# Patient Record
Sex: Male | Born: 1985
Health system: Southern US, Community
[De-identification: ages and names within clinical notes are randomized; demographics above are authoritative.]

## PROBLEM LIST (undated history)

## (undated) DIAGNOSIS — K649 Unspecified hemorrhoids: Secondary | ICD-10-CM

## (undated) DIAGNOSIS — K219 Gastro-esophageal reflux disease without esophagitis: Secondary | ICD-10-CM

## (undated) DIAGNOSIS — J189 Pneumonia, unspecified organism: Secondary | ICD-10-CM

## (undated) DIAGNOSIS — J309 Allergic rhinitis, unspecified: Secondary | ICD-10-CM

## (undated) DIAGNOSIS — R51 Headache: Secondary | ICD-10-CM

## (undated) DIAGNOSIS — F419 Anxiety disorder, unspecified: Secondary | ICD-10-CM

## (undated) DIAGNOSIS — Z9889 Other specified postprocedural states: Secondary | ICD-10-CM

## (undated) DIAGNOSIS — L0291 Cutaneous abscess, unspecified: Secondary | ICD-10-CM

## (undated) DIAGNOSIS — T4145XA Adverse effect of unspecified anesthetic, initial encounter: Secondary | ICD-10-CM

## (undated) DIAGNOSIS — R112 Nausea with vomiting, unspecified: Secondary | ICD-10-CM

## (undated) HISTORY — PX: TONSILLECTOMY: SUR1361

## (undated) HISTORY — DX: Cutaneous abscess, unspecified: L02.91

## (undated) HISTORY — DX: Allergic rhinitis, unspecified: J30.9

## (undated) HISTORY — DX: Unspecified hemorrhoids: K64.9

## (undated) HISTORY — DX: Gastro-esophageal reflux disease without esophagitis: K21.9

## (undated) HISTORY — DX: Anxiety disorder, unspecified: F41.9

---
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
## 2007-04-22 HISTORY — PX: FRACTURE SURGERY: SHX138

## 2007-05-17 ENCOUNTER — Encounter: Payer: Self-pay | Admitting: Internal Medicine

## 2007-05-26 ENCOUNTER — Encounter: Payer: Self-pay | Admitting: Internal Medicine

## 2007-05-26 ENCOUNTER — Ambulatory Visit: Payer: Self-pay | Admitting: Internal Medicine

## 2007-05-28 ENCOUNTER — Ambulatory Visit: Payer: Self-pay | Admitting: Internal Medicine

## 2007-05-28 ENCOUNTER — Telehealth: Payer: Self-pay | Admitting: Internal Medicine

## 2007-05-28 DIAGNOSIS — J0100 Acute maxillary sinusitis, unspecified: Secondary | ICD-10-CM

## 2007-05-28 DIAGNOSIS — J309 Allergic rhinitis, unspecified: Secondary | ICD-10-CM | POA: Insufficient documentation

## 2007-05-28 HISTORY — DX: Allergic rhinitis, unspecified: J30.9

## 2007-05-28 LAB — CONVERTED CEMR LAB: Rapid Strep: NEGATIVE

## 2007-07-08 ENCOUNTER — Ambulatory Visit: Payer: Self-pay | Admitting: Internal Medicine

## 2007-07-08 DIAGNOSIS — IMO0002 Reserved for concepts with insufficient information to code with codable children: Secondary | ICD-10-CM | POA: Insufficient documentation

## 2007-07-18 ENCOUNTER — Emergency Department (HOSPITAL_COMMUNITY): Admission: EM | Admit: 2007-07-18 | Discharge: 2007-07-18 | Payer: Self-pay | Admitting: Emergency Medicine

## 2007-08-23 ENCOUNTER — Telehealth: Payer: Self-pay | Admitting: Internal Medicine

## 2007-08-27 ENCOUNTER — Ambulatory Visit: Payer: Self-pay | Admitting: Internal Medicine

## 2007-08-27 DIAGNOSIS — J069 Acute upper respiratory infection, unspecified: Secondary | ICD-10-CM | POA: Insufficient documentation

## 2007-11-26 ENCOUNTER — Ambulatory Visit: Payer: Self-pay | Admitting: Internal Medicine

## 2007-11-26 DIAGNOSIS — L259 Unspecified contact dermatitis, unspecified cause: Secondary | ICD-10-CM

## 2007-12-02 ENCOUNTER — Emergency Department (HOSPITAL_BASED_OUTPATIENT_CLINIC_OR_DEPARTMENT_OTHER): Admission: EM | Admit: 2007-12-02 | Discharge: 2007-12-02 | Payer: Self-pay | Admitting: Emergency Medicine

## 2007-12-07 ENCOUNTER — Telehealth: Payer: Self-pay | Admitting: Internal Medicine

## 2008-01-20 ENCOUNTER — Ambulatory Visit: Payer: Self-pay | Admitting: Internal Medicine

## 2008-02-08 ENCOUNTER — Telehealth: Payer: Self-pay | Admitting: Internal Medicine

## 2008-03-20 ENCOUNTER — Telehealth: Payer: Self-pay | Admitting: Internal Medicine

## 2008-03-20 ENCOUNTER — Ambulatory Visit: Payer: Self-pay | Admitting: Internal Medicine

## 2008-03-20 DIAGNOSIS — G4700 Insomnia, unspecified: Secondary | ICD-10-CM | POA: Insufficient documentation

## 2008-03-20 DIAGNOSIS — H00019 Hordeolum externum unspecified eye, unspecified eyelid: Secondary | ICD-10-CM

## 2008-03-22 ENCOUNTER — Telehealth: Payer: Self-pay | Admitting: Internal Medicine

## 2008-05-01 ENCOUNTER — Ambulatory Visit: Payer: Self-pay | Admitting: Internal Medicine

## 2008-05-01 ENCOUNTER — Telehealth: Payer: Self-pay | Admitting: Internal Medicine

## 2008-05-01 DIAGNOSIS — R05 Cough: Secondary | ICD-10-CM

## 2008-05-01 DIAGNOSIS — J209 Acute bronchitis, unspecified: Secondary | ICD-10-CM

## 2008-05-01 DIAGNOSIS — T7840XA Allergy, unspecified, initial encounter: Secondary | ICD-10-CM | POA: Insufficient documentation

## 2008-05-02 ENCOUNTER — Telehealth: Payer: Self-pay | Admitting: Internal Medicine

## 2008-05-19 ENCOUNTER — Ambulatory Visit: Payer: Self-pay | Admitting: Internal Medicine

## 2008-05-22 ENCOUNTER — Ambulatory Visit: Payer: Self-pay | Admitting: Internal Medicine

## 2008-05-22 DIAGNOSIS — J13 Pneumonia due to Streptococcus pneumoniae: Secondary | ICD-10-CM | POA: Insufficient documentation

## 2008-06-23 ENCOUNTER — Telehealth: Payer: Self-pay | Admitting: Internal Medicine

## 2008-11-13 ENCOUNTER — Telehealth: Payer: Self-pay | Admitting: Internal Medicine

## 2009-02-23 ENCOUNTER — Ambulatory Visit: Payer: Self-pay | Admitting: Family Medicine

## 2009-05-03 ENCOUNTER — Ambulatory Visit: Payer: Self-pay | Admitting: Family Medicine

## 2010-05-13 ENCOUNTER — Telehealth: Payer: Self-pay | Admitting: Internal Medicine

## 2010-05-14 ENCOUNTER — Encounter: Payer: Self-pay | Admitting: *Deleted

## 2010-05-14 ENCOUNTER — Ambulatory Visit: Admit: 2010-05-14 | Payer: Self-pay | Admitting: Internal Medicine

## 2010-05-14 ENCOUNTER — Telehealth: Payer: Self-pay | Admitting: *Deleted

## 2010-05-14 DIAGNOSIS — L309 Dermatitis, unspecified: Secondary | ICD-10-CM | POA: Insufficient documentation

## 2010-05-14 NOTE — Telephone Encounter (Signed)
Appointment today

## 2010-05-21 NOTE — Assessment & Plan Note (Signed)
Summary: congestion//ccm   Vital Signs:  Patient profile:   25 year old male Temp:     98.6 degrees F oral BP sitting:   130 / 82  (left arm) Cuff size:   regular  Vitals Entered By: Sid Falcon LPN (May 03, 2009 2:30 PM) CC: Nasal congestion X 3-4 days   History of Present Illness: Acute visit. For a history of nasal congestion and cough mostly dry. Sinus congestion and sinus pressure with intermittent headache. History of probable pneumonia last year. Felt somewhat similar then. Diffuse body aches. Mild sore throat. Denies any nausea, vomiting, or skin rashes. Nonsmoker.  Allergies: 1)  ! Biaxin (Clarithromycin) 2)  ! Penicillin G Sodium (Penicillin G Sodium) 3)  ! Bactrim Ds (Sulfamethoxazole-Trimethoprim)  Past History:  Past Medical History: Last updated: 05/28/2007 Allergic rhinitis  Review of Systems      See HPI  Physical Exam  General:  Well-developed,well-nourished,in no acute distress; alert,appropriate and cooperative throughout examination Head:  Normocephalic and atraumatic without obvious abnormalities. No apparent alopecia or balding. Eyes:  No corneal or conjunctival inflammation noted. EOMI. Perrla. Funduscopic exam benign, without hemorrhages, exudates or papilledema. Vision grossly normal. Ears:  External ear exam shows no significant lesions or deformities.  Otoscopic examination reveals clear canals, tympanic membranes are intact bilaterally without bulging, retraction, inflammation or discharge. Hearing is grossly normal bilaterally. Nose:  External nasal examination shows no deformity or inflammation. Nasal mucosa are pink and moist without lesions or exudates. Mouth:  Oral mucosa and oropharynx without lesions or exudates.  Teeth in good repair. Neck:  No deformities, masses, or tenderness noted. Lungs:  Normal respiratory effort, chest expands symmetrically. Lungs are clear to auscultation, no crackles or wheezes. Heart:  Normal rate and  regular rhythm. S1 and S2 normal without gallop, murmur, click, rub or other extra sounds.   Impression & Recommendations:  Problem # 1:  URI (ICD-465.9) at this point, suspect viral origin. No antibiotics recommended. Only start antibiotics if he has sudden fever or progressive productive cough. He has allergies to multiple drugs including sulfa, penicillin, and Biaxin.  Complete Medication List: 1)  Levaquin 500 Mg Tabs (Levofloxacin) .... One by mouth once daily for 7 days  Patient Instructions: 1)  Acute Bronchitis symptoms for less then 10 days are not  helped by antibiotics. Take over the counter cough medications. Call if no improvement in 5-7 days, sooner if increasing cough, fever, or new symptoms ( shortness of breath, chest pain) .  Prescriptions: LEVAQUIN 500 MG TABS (LEVOFLOXACIN) one by mouth once daily for 7 days  #7 x 0   Entered and Authorized by:   Evelena Peat MD   Signed by:   Evelena Peat MD on 05/03/2009   Method used:   Print then Give to Patient   RxID:   [id number]

## 2010-05-23 NOTE — Progress Notes (Signed)
  Phone Note Call from Patient   Summary of Call: call from g-mother- pt co ming home from staying with [facility] and is very sick- request to see dr Naoma Diener this pm--g-mother number is [phone] Initial call taken by: Willy Eddy, LPN,  May 13, 2010 9:15 AM  Follow-up for Phone Call        per dr j Rudene Re- can come in tomorrow at 12 noon-   Follow-up by: Willy Eddy, LPN,  May 13, 2010 9:23 AM  Additional Follow-up for Phone Call Additional follow up Details #1::        Appt tomorow at [facility] Additional Follow-up by: [facility] CMA AAMA,  May 13, 2010 10:00 AM

## 2010-06-13 ENCOUNTER — Ambulatory Visit (INDEPENDENT_AMBULATORY_CARE_PROVIDER_SITE_OTHER): Payer: 59 | Admitting: Internal Medicine

## 2010-06-13 ENCOUNTER — Encounter: Payer: Self-pay | Admitting: Internal Medicine

## 2010-06-13 ENCOUNTER — Telehealth: Payer: Self-pay | Admitting: *Deleted

## 2010-06-13 VITALS — BP 136/80 | HR 64 | Temp 98.7°F | Resp 14 | Ht 72.0 in | Wt 208.0 lb

## 2010-06-13 DIAGNOSIS — F329 Major depressive disorder, single episode, unspecified: Secondary | ICD-10-CM | POA: Insufficient documentation

## 2010-06-13 DIAGNOSIS — F341 Dysthymic disorder: Secondary | ICD-10-CM

## 2010-06-13 DIAGNOSIS — R110 Nausea: Secondary | ICD-10-CM

## 2010-06-13 DIAGNOSIS — K219 Gastro-esophageal reflux disease without esophagitis: Secondary | ICD-10-CM

## 2010-06-13 MED ORDER — MIRTAZAPINE 30 MG PO TABS: 30.0000 mg | ORAL_TABLET | Freq: Every day | ORAL | Status: AC

## 2010-06-13 MED ORDER — METOCLOPRAMIDE HCL 5 MG PO TABS: 5.0000 mg | ORAL_TABLET | Freq: Four times a day (QID) | ORAL | Status: AC | PRN

## 2010-06-13 MED ORDER — PROMETHAZINE HCL 25 MG PO TABS: 25.0000 mg | ORAL_TABLET | Freq: Four times a day (QID) | ORAL | Status: DC | PRN

## 2010-06-13 NOTE — Progress Notes (Signed)
Subjective:    Patient ID: Devin Burton, male    DOB: 06-23-1985, 25 y.o.   MRN: [id number]  HPI  patient is a 25 year old white male who presents with two complaint. The first is a sense of nausea that occurs first thing in the morning he states that he awakened with nausea and sometimes it worsens during the day especially after eating he has felt like he needed to throw up after meals on several occasions.  This caused a decrease in appetite however his weight is stable at this point. 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  He states that he gets an average of 2-3 hours of sleep a night has restless sleep when he is in bed and does admit to some decreased symptoms of satisfaction in  Life's  Activity but denies any suicidal  Ideology.                                                                                                                                                                                                                                                                                                                                                                                                                                    tReview of Systems  Constitutional: Negative for fever and fatigue.  HENT: Negative for hearing loss, congestion, neck pain and postnasal drip.   Eyes: Negative for discharge, redness and visual disturbance.  Respiratory: Negative for cough, shortness of breath and wheezing.   Cardiovascular: Negative for leg swelling.  Gastrointestinal: Negative for abdominal pain, constipation and abdominal distention.  Genitourinary: Negative for urgency and frequency.  Musculoskeletal: Negative for joint swelling and arthralgias.  Skin: Negative for color change and rash.  Neurological:  Negative for weakness and light-headedness.  Hematological: Negative for adenopathy.  Psychiatric/Behavioral: Negative for behavioral problems.   Past Medical History  Diagnosis Date  . INSOMNIA, CHRONIC 03/20/2008  . ALLERGIC RHINITIS 05/28/2007   Past Surgical History  Procedure Date  . Fracture surgery 2009    right wrist    reports that he has never smoked. He does not have any smokeless tobacco history on file. He reports that he does not drink alcohol or use illicit drugs. family history includes Cancer in his father and HIV in his father and mother.        Objective:   Physical Exam  Constitutional: He is oriented to person, place, and time. He appears well-developed and well-nourished.  HENT:  Head: Normocephalic and atraumatic.  Eyes: Conjunctivae are normal. Pupils are equal, round, and reactive to light.  Neck: Normal range of motion. Neck supple.  Cardiovascular: Normal rate and regular rhythm.   Pulmonary/Chest: Effort normal and breath sounds normal.  Abdominal: Soft. Bowel sounds are normal. He exhibits no mass. There is no tenderness. There is no rebound and no guarding.  Musculoskeletal: Normal range of motion.  Neurological: He is alert and oriented to person, place, and time.  Psychiatric: His behavior is normal. His mood appears anxious. Thought content is not delusional. He exhibits a depressed mood. He expresses no suicidal plans and no homicidal plans. He is attentive.          Assessment & Plan:   The patient is a healthy appearing white male he has 2 distinct problems today one is gastroesophageal reflux disease with possible P. Ulcerative disease we'll put him on a trial of Nexium 40 mg by mouth daily for the next 6 weeks for his morning nausea we'll give him a temporary prescription of Reglan 5 mg by mouth when necessary nausea not to exceed 4 doses a day and will begin Remeron 30 mg at bedtime for treatment of his depression. He will followup in 4-5  weeks

## 2010-06-13 NOTE — Telephone Encounter (Signed)
Pt was seen today for nausea and now he has diarrhea.  Per Dr Lovell Sheehan don't take any new meds except for nexium and call in Phenergan 25 mg 1 po every 4-6 hrs prn for nausea.  And clear diet.  Pt aware

## 2010-07-15 ENCOUNTER — Ambulatory Visit: Payer: 59 | Admitting: Internal Medicine

## 2010-09-04 ENCOUNTER — Ambulatory Visit (INDEPENDENT_AMBULATORY_CARE_PROVIDER_SITE_OTHER): Payer: 59 | Admitting: Internal Medicine

## 2010-09-04 ENCOUNTER — Encounter: Payer: Self-pay | Admitting: Internal Medicine

## 2010-09-04 VITALS — BP 120/80 | HR 68 | Temp 98.7°F | Ht 71.0 in | Wt 220.0 lb

## 2010-09-04 DIAGNOSIS — K219 Gastro-esophageal reflux disease without esophagitis: Secondary | ICD-10-CM

## 2010-09-04 DIAGNOSIS — L738 Other specified follicular disorders: Secondary | ICD-10-CM

## 2010-09-04 DIAGNOSIS — L739 Follicular disorder, unspecified: Secondary | ICD-10-CM

## 2010-09-04 DIAGNOSIS — K649 Unspecified hemorrhoids: Secondary | ICD-10-CM

## 2010-09-04 MED ORDER — BENZOYL PEROXIDE 2.5 % EX GEL: 1.0000 "application " | Freq: Two times a day (BID) | CUTANEOUS | Status: DC

## 2010-09-04 MED ORDER — DOXYCYCLINE HYCLATE 100 MG PO TABS: 100.0000 mg | ORAL_TABLET | Freq: Two times a day (BID) | ORAL | Status: DC

## 2010-09-04 MED ORDER — ESOMEPRAZOLE MAGNESIUM 20 MG PO CPDR: 20.0000 mg | DELAYED_RELEASE_CAPSULE | Freq: Every day | ORAL | Status: DC

## 2010-09-04 NOTE — Patient Instructions (Signed)
Use Dove soap only and a washcloth to clean the area in the buttocks Do  not use any antibiotics soaps. Use the Dove unscented and do not use any scented soaps or washes. Cotton briefs and looser jeans to let the area stay dry white working outside   For  internal hemorrhoids you need to use the suppositories rather than topical cream the blood is coming from hemorrhoids inside the rectum

## 2010-09-04 NOTE — Progress Notes (Signed)
  Subjective:    Patient ID: Devin Burton, male    DOB: 01-04-1986, 25 y.o.   MRN: [id number]  HPI  Pt has internal hemorhoids This was seen in urgent care for a rash on the buttocks bilaterally he was given a different medications including topical and antifungals.  On physical examination today it is appearing to be ingrown hairs and folliculitis.  Has a history of internal hemorrhoids have flared recently worsened with the medications that he was given    Review of Systems  Constitutional: Negative for fever and fatigue.  HENT: Negative for hearing loss, congestion, neck pain and postnasal drip.   Eyes: Negative for discharge, redness and visual disturbance.  Respiratory: Negative for cough, shortness of breath and wheezing.   Cardiovascular: Negative for leg swelling.  Gastrointestinal: Negative for abdominal pain, constipation and abdominal distention.  Genitourinary: Negative for urgency and frequency.  Musculoskeletal: Negative for joint swelling and arthralgias.  Skin: Negative for color change and rash.  Neurological: Negative for weakness and light-headedness.  Hematological: Negative for adenopathy.  Psychiatric/Behavioral: Negative for behavioral problems.   Past Medical History  Diagnosis Date  . INSOMNIA, CHRONIC 03/20/2008  . ALLERGIC RHINITIS 05/28/2007   Past Surgical History  Procedure Date  . Fracture surgery 2009    right wrist    reports that he has never smoked. He does not have any smokeless tobacco history on file. He reports that he does not drink alcohol or use illicit drugs. family history includes Cancer in his father and HIV in his father and mother. Allergies  Allergen Reactions  . Clarithromycin     REACTION: SOB, Palpitations  . Penicillins   . Sulfamethoxazole W/Trimethoprim        Objective:   Physical Exam  Constitutional: He appears well-developed and well-nourished.  HENT:  Head: Normocephalic and atraumatic.  Eyes: Conjunctivae  are normal. Pupils are equal, round, and reactive to light.  Neck: Normal range of motion. Neck supple.  Cardiovascular: Normal rate and regular rhythm.   Pulmonary/Chest: Effort normal and breath sounds normal.  Abdominal: Soft. Bowel sounds are normal.  Genitourinary:       Internal hemorrhoids  Skin: Rash noted. There is erythema.          Assessment & Plan:  Doxycycline 1 by mouth twice a day for 14 days and the peroxide cleanser twice daily.  4 hemorrhoids hemorrhoid suppositories were suggested as opposed to hemorrhoid creams and increased fiber in his diet

## 2010-09-11 ENCOUNTER — Telehealth: Payer: Self-pay

## 2010-09-11 ENCOUNTER — Ambulatory Visit (INDEPENDENT_AMBULATORY_CARE_PROVIDER_SITE_OTHER): Payer: 59 | Admitting: Internal Medicine

## 2010-09-11 ENCOUNTER — Encounter: Payer: Self-pay | Admitting: Internal Medicine

## 2010-09-11 DIAGNOSIS — K649 Unspecified hemorrhoids: Secondary | ICD-10-CM

## 2010-09-11 DIAGNOSIS — K625 Hemorrhage of anus and rectum: Secondary | ICD-10-CM

## 2010-09-11 LAB — CBC WITH DIFFERENTIAL/PLATELET
Basophils Absolute: 0 10*3/uL (ref 0.0–0.1)
Basophils Relative: 0.5 % (ref 0.0–3.0)
Eosinophils Absolute: 0.6 10*3/uL (ref 0.0–0.7)
Eosinophils Relative: 7 % — ABNORMAL HIGH (ref 0.0–5.0)
HCT: 46.5 % (ref 39.0–52.0)
Hemoglobin: 16 g/dL (ref 13.0–17.0)
Lymphocytes Relative: 33 % (ref 12.0–46.0)
Lymphs Abs: 2.7 10*3/uL (ref 0.7–4.0)
MCHC: 34.3 g/dL (ref 30.0–36.0)
MCV: 91.1 fl (ref 78.0–100.0)
Monocytes Absolute: 0.8 10*3/uL (ref 0.1–1.0)
Monocytes Relative: 10.2 % (ref 3.0–12.0)
Neutro Abs: 4 10*3/uL (ref 1.4–7.7)
Neutrophils Relative %: 49.3 % (ref 43.0–77.0)
Platelets: 246 10*3/uL (ref 150.0–400.0)
RBC: 5.11 Mil/uL (ref 4.22–5.81)
RDW: 12.7 % (ref 11.5–14.6)
WBC: 8.2 10*3/uL (ref 4.5–10.5)

## 2010-09-11 MED ORDER — HYDROCORTISONE ACE-PRAMOXINE 1-1 % RE FOAM: 1.0000 | Freq: Two times a day (BID) | RECTAL | Status: AC

## 2010-09-11 NOTE — Telephone Encounter (Signed)
Pt.notified

## 2010-09-11 NOTE — Telephone Encounter (Signed)
Message copied by Kyung Rudd on Wed Sep 11, 2010  4:04 PM ------      Message from: Letitia Libra, Maisie Fus      Created: Wed Sep 11, 2010  1:05 PM       Cbc nl

## 2010-09-11 NOTE — Progress Notes (Signed)
  Subjective:    Patient ID: Devin Burton, male    DOB: 1985/05/12, 25 y.o.   MRN: [id number]  HPI Pt presents to clinic for evaluation of rectal bleeding. Has known history of hemorrhoids per patient. States in the past as noted external hemorrhoid protruding. Has intermittent exacerbations with discomfort and itching and currently notes the symptoms. This morning while having a bowel movement. Moderate amount of red blood in the toilet and on wiping. Is attempting suppository for his hemorrhoids. No recent constipation or straining. No alleviating or exacerbating factors. No other complaints  Reviewed past medical history, medications and allergies.  Review of Systems  Constitutional: Negative for fever and chills.  Gastrointestinal: Positive for anal bleeding and rectal pain. Negative for nausea and abdominal pain.       Objective:   Physical Exam  Nursing note and vitals reviewed. Constitutional: He appears well-developed and well-nourished. No distress.  HENT:  Head: Normocephalic and atraumatic.  Eyes: Conjunctivae are normal. No scleral icterus.  Genitourinary:       Rectum examined. Soft tissue prominence approximately 9:00 position mildly tender to palpation. No obvious fissure.  Skin: Skin is warm and dry. No rash noted. He is not diaphoretic. No erythema.  Psychiatric: He has a normal mood and affect.          Assessment & Plan:

## 2010-09-11 NOTE — Assessment & Plan Note (Signed)
Likely hemorrhoidal source based on history and exam. Obtain CBC given increase in blood noted. Attempt ProctoFoam HC. Followup closely with PMD if no improvement or worsening. States understanding and agreement.

## 2010-09-13 ENCOUNTER — Ambulatory Visit (INDEPENDENT_AMBULATORY_CARE_PROVIDER_SITE_OTHER): Payer: 59 | Admitting: Internal Medicine

## 2010-09-13 ENCOUNTER — Encounter: Payer: Self-pay | Admitting: Internal Medicine

## 2010-09-13 DIAGNOSIS — K625 Hemorrhage of anus and rectum: Secondary | ICD-10-CM | POA: Insufficient documentation

## 2010-09-13 DIAGNOSIS — Z206 Contact with and (suspected) exposure to human immunodeficiency virus [HIV]: Secondary | ICD-10-CM

## 2010-09-13 DIAGNOSIS — Z20828 Contact with and (suspected) exposure to other viral communicable diseases: Secondary | ICD-10-CM

## 2010-09-13 DIAGNOSIS — Z9189 Other specified personal risk factors, not elsewhere classified: Secondary | ICD-10-CM

## 2010-09-13 DIAGNOSIS — Z202 Contact with and (suspected) exposure to infections with a predominantly sexual mode of transmission: Secondary | ICD-10-CM

## 2010-09-13 LAB — CBC WITH DIFFERENTIAL/PLATELET
Basophils Absolute: 0.1 10*3/uL (ref 0.0–0.1)
Basophils Relative: 0.6 % (ref 0.0–3.0)
Eosinophils Absolute: 0.5 10*3/uL (ref 0.0–0.7)
Eosinophils Relative: 5.5 % — ABNORMAL HIGH (ref 0.0–5.0)
HCT: 46.1 % (ref 39.0–52.0)
Hemoglobin: 15.9 g/dL (ref 13.0–17.0)
Lymphocytes Relative: 28.6 % (ref 12.0–46.0)
Lymphs Abs: 2.5 10*3/uL (ref 0.7–4.0)
MCHC: 34.5 g/dL (ref 30.0–36.0)
MCV: 91.2 fl (ref 78.0–100.0)
Monocytes Absolute: 1 10*3/uL (ref 0.1–1.0)
Monocytes Relative: 11.3 % (ref 3.0–12.0)
Neutro Abs: 4.7 10*3/uL (ref 1.4–7.7)
Neutrophils Relative %: 54 % (ref 43.0–77.0)
Platelets: 235 10*3/uL (ref 150.0–400.0)
RBC: 5.06 Mil/uL (ref 4.22–5.81)
RDW: 12.9 % (ref 11.5–14.6)
WBC: 8.8 10*3/uL (ref 4.5–10.5)

## 2010-09-13 NOTE — Assessment & Plan Note (Signed)
Repeat CBC given increasing amount of blood. Failed to visualize adequately with endoscopy. GI consult. Present to the emergency department with increased bleeding abdominal pain dizziness or syncope.

## 2010-09-13 NOTE — Progress Notes (Signed)
  Subjective:    Patient ID: Devin Burton, male    DOB: [birth year], 25 y.o.   MRN: [id number]  HPI Pt presents to clinic for evaluation of rectal bleeding. Recently seen forty-eight hours ago with rectal bleeding with bowel movement felt to be possibly representative of hemorrhoid. Since that time has had continued bleeding with every bowel movement with associated rectal pain. Patient provided a picture of last bowel movement with moderate amount of blood on towel and what appears to be a darker clot. Denies dizziness or presyncope. No associated abdominal pain. No alleviating or exacerbating factors. Also requests HIV testing as he indicates has a family member with the infection. No high-risk behavior personally. No other complaints  Reviewed past medical history, medications and allergies.  Review of Systems see history of present illness     Objective:   Physical Exam  Nursing note and vitals reviewed. Constitutional: He appears well-developed and well-nourished. No distress.  HENT:  Head: Normocephalic and atraumatic.  Right Ear: External ear normal.  Left Ear: External ear normal.  Nose: Nose normal.  Eyes: Conjunctivae are normal. No scleral icterus.  Genitourinary: Rectal exam shows tenderness. Rectal exam shows no mass and anal tone normal.       Attempted anoscopy but terminated due to patient's discomfort. Visualized approximately 2 cm without lesion, mass or evidence of active bleeding  Neurological: He is alert.  Skin: Skin is warm and dry. He is not diaphoretic.  Psychiatric: He has a normal mood and affect.          Assessment & Plan:

## 2010-09-13 NOTE — Assessment & Plan Note (Signed)
No direct inoculation. Obtain HIV test

## 2010-09-17 ENCOUNTER — Ambulatory Visit (INDEPENDENT_AMBULATORY_CARE_PROVIDER_SITE_OTHER): Payer: 59 | Admitting: Internal Medicine

## 2010-09-17 ENCOUNTER — Encounter: Payer: Self-pay | Admitting: Internal Medicine

## 2010-09-17 VITALS — BP 134/80 | HR 86 | Ht 71.0 in | Wt 217.0 lb

## 2010-09-17 DIAGNOSIS — L0233 Carbuncle of buttock: Secondary | ICD-10-CM

## 2010-09-17 DIAGNOSIS — L0232 Furuncle of buttock: Secondary | ICD-10-CM | POA: Insufficient documentation

## 2010-09-17 DIAGNOSIS — K602 Anal fissure, unspecified: Secondary | ICD-10-CM

## 2010-09-17 DIAGNOSIS — K625 Hemorrhage of anus and rectum: Secondary | ICD-10-CM

## 2010-09-17 MED ORDER — AMBULATORY NON FORMULARY MEDICATION: Status: DC

## 2010-09-17 NOTE — Patient Instructions (Addendum)
Start 1 dose of MiraLax daily and increase fiber in your diet. Start the diltiazem gel - go to [facility] to get that.  High-Fiber Diet A high-fiber diet changes your normal diet to include more whole grains, legumes, fruits, and vegetables. Changes in the diet involve replacing refined carbohydrates with unrefined foods. The calorie level of the diet is essentially unchanged. The Dietary Reference Intake (recommended amount) for adult males is 38 grams per day. For adult females, it is 25 grams per day. Pregnant and lactating women should consume 28 grams of fiber per day. Fiber is the intact part of a plant that is not broken down during digestion. Functional fiber is fiber that has been isolated from the plant to provide a beneficial effect in the body. PURPOSE  Increase stool bulk.   Ease and regulate bowel movements.   Lower cholesterol.  INDICATIONS THAT YOU NEED MORE FIBER  Constipation and hemorrhoids.   Uncomplicated diverticulosis (intestine condition) and irritable bowel syndrome.   Weight management.   As a protective measure against hardening of the arteries (atherosclerosis), diabetes, and cancer.  NOTE OF CAUTION If you have a digestive or bowel problem, ask your caregiver for advice before adding high-fiber foods to your diet. Some of the following medical problems are such that a high-fiber diet should not be used without consulting your caregiver. DO NOT USE WITH:  Acute diverticulitis (intestine infection).   Partial small bowel obstructions.   Complicated diverticular disease involving bleeding, rupture (perforation), or abscess (boil, furuncle).   Presence of autonomic neuropathy (nerve damage) or gastric paresis (stomach cannot empty itself).  GUIDELINES FOR INCREASING FIBER IN THE DIET  Start adding fiber to the diet slowly. A gradual increase of about 5 more grams (2 slices of whole-wheat bread, 2 servings of most fruits or vegetables, or 1 bowl  of high-fiber cereal) per day is best. Too rapid an increase in fiber may result in constipation, flatulence, and bloating.   Drink enough water and fluids to keep your urine clear or pale yellow. Water, juice, or caffeine-free drinks are recommended. Not drinking enough fluid may cause constipation.   Eat a variety of high-fiber foods rather than one type of fiber.   Try to increase your intake of fiber through using high-fiber foods rather than fiber pills or supplements that contain small amounts of fiber.   The goal is to change the types of food eaten. Do not supplement your present diet with high-fiber foods, but replace foods in your present diet.  INCLUDE A VARIETY OF FIBER SOURCES  Replace refined and processed grains with whole grains, canned fruits with fresh fruits, and incorporate other fiber sources. White rice, white breads, and most bakery goods contain little or no fiber.   Brown whole-grain rice, buckwheat oats, and many fruits and vegetables are all good sources of fiber. These include: broccoli, Brussels sprouts, cabbage, cauliflower, beets, sweet potatoes, white potatoes (skin on), carrots, tomatoes, eggplant, squash, berries, fresh fruits, and dried fruits.   Cereals appear to be the richest source of fiber. Cereal fiber is found in whole grains and bran. Bran is the fiber-rich outer coat of cereal grain, which is largely removed in refining. In whole-grain cereals, the bran remains. In breakfast cereals, the largest amount of fiber is found in those with "bran" in their names. The fiber content is sometimes indicated on the label.   You may need to include additional fruits and vegetables each day.   In baking, for 1  cup white flour, you may use the following substitutions:   1 cup whole-wheat flour minus 2 tablespoons.   1/2 cup white flour plus 1/2 cup whole-wheat flour.  References: Dietary Reference Intakes: Recommended Intakes for Individuals. [employer]. Institute of Medicine. Food and Nutrition Board. Document Released: 04/07/2005 Document Re-Released: 07/02/2009 [facility] Patient Information 2011 [place], [place].  Anal Fissure An anal fissure often begins with sharp pain. This is usually following a bowel movement. It often causes bright red blood stained stools. It is the most common cause of rectal bleeding. One common cause of this is passage of a large, hard stool. It can also be caused by having frequent diarrheal stools. Anal fissures that occur for a longtime (chronic) may require surgery. CAUSES  Passing large, hard stools.   Frequent diarrheal stools.   Constipation.  SYMPTOMS  Bright red, blood stained stools.   Rectal bleeding.  HOME CARE INSTRUCTIONS  If constipation is the cause of the rectal fissure, it may be necessary to add a bulk-forming laxative. A diet high in fruits, whole grains, and vegetables will also help.   Taking hot sitz baths for 1 half hour 4 times per day may help.   Increase your fluid intake.   Only take over-the-counter or prescription medicines for pain, discomfort, or fever as directed by your caregiver. Do not take aspirin as this may increase the tendency for bleeding.   Do not use ointments containing anesthetic medications or hydrocortisone. They could slow healing.   Avoid constipating foods such as bananas and cheese.   In children, brown Karo syrup may be used by adding 1 teaspoon of syrup to 8 ounces of formula. An alternative is to give 3 teaspoons of mineral oil every day.  SEEK MEDICAL CARE IF: Rectal bleeding continues, changes in intensity, or becomes more severe. MAKE SURE YOU:   Understand these instructions.   Will watch your condition.   Will get help right away if you are not doing well or get worse.  Document Released: 04/07/2005 Document Re-Released: 07/02/2009 [facility] Patient Information 2011 [place], [place].    You have been scheduled for an  appointmet with Dr, Corliss Skains on September 24, 2010 arrive at 1:00pm. Your prescription has been sent to [facility] in [facility]. Use Miralax as directed.

## 2010-09-17 NOTE — Assessment & Plan Note (Signed)
I think the overall clinical scenario is most consistent with this. We'll work on softening his stools with fiber increase in diet as well as MiraLax. Diltiazem gel 2% will be applied and an inefficient handout was given. Warm sitz baths are recommended. I will arrange a surgery visit for this but also mainly for this boil on his buttock it has not totally responded to antibiotics and may need incision and drainage

## 2010-09-17 NOTE — Progress Notes (Signed)
  Subjective:    Patient ID: Devin Burton, male    DOB: 11-14-1985, 25 y.o.   MRN: [id number]  HPI 25 year old single white man with rectal bleeding and rectal pain for several weeks. He has a history of hemorrhoids she says with intermittent protrusion. He's had some minor blood on the tissue but in the past couple of weeks he's had problems with greater amount of blood mostly still in the tissue. It bright red usually a photograph of a toilet tissue stained with blood. He saw primary care twice last week, his hemoglobin was normal twice. He had some mild protrusion at the anal area and was thought that hemorrhoids and was treated with some positive worries and ProctoFoam. He could not tolerate an anoscopy exam. It was too painful. He is having some increasing constipation and hard stools lately. There is some associated left lower quadrant pain that is relieved with defecation. He has not had any anal trauma. It is very painful to defecate particularly in the posterior aspect of the anal area.  He has also been treated with 2 rounds of antibiotics, doxycycline use, for what is thought to be folliculitis of the buttock area. He does sweat they're quite a bit. He's not had any purulent drainage but is left with at least one lesion he says that is tender, and bothers him some.  Related history is that he is significantly stressed over  recently learning that his father had HIV disease. His father is dying of a brain tumor though he lives out of the area. The patient had requested HIV testing though he has no blood or body fluid exposures he was concerned in that particular study is pending.  He has moved back to this area after being in [place] and [address], he is currently working for the city of [place].  Review of Systems  all other review of systems negative.    Objective:   Physical Exam  Constitutional: He appears well-developed and well-nourished.  HENT:  Head: Normocephalic.    Mouth/Throat: Oropharynx is clear and moist.  Eyes: Conjunctivae are normal. No scleral icterus.  Cardiovascular: Normal rate, regular rhythm and normal heart sounds.   Pulmonary/Chest: Effort normal and breath sounds normal.  Abdominal: Soft. Bowel sounds are normal. He exhibits no mass. There is no tenderness.  Genitourinary:       On the right buttock there is a 6-7 round crusted red lesion that is nontender. No surrounding erythema. This is more in the coccygeal prominence but not at the midline. Inspection of the anoderm shows it looks overall normal. Digital rectal exam with fifth digit shows moderate tenderness posteriorly. He is able to tolerate some of my index finger being inserted as well but is very painful overall. I don't see any discrete hemorrhoids or sentinel pile.          Assessment & Plan:

## 2010-09-17 NOTE — Assessment & Plan Note (Signed)
This is most likely a combination of fissure and hemorrhoids. Given his age and overall situation have not pursue endoscopic evaluation at this time though he could needed. We'll await the surgeon's evaluation and I will see him back in 2 months.

## 2010-09-18 ENCOUNTER — Telehealth: Payer: Self-pay

## 2010-09-18 NOTE — Telephone Encounter (Signed)
Message copied by Beverely Low on Wed Sep 18, 2010  2:22 PM ------      Message from: Staci Righter      Created: Mon Sep 16, 2010  1:11 PM       Cbc nl

## 2010-09-18 NOTE — Telephone Encounter (Signed)
Pt notified and is aware that HIV test is still pending

## 2010-09-20 ENCOUNTER — Telehealth: Payer: Self-pay

## 2010-09-20 LAB — HIV 1/2 CONFIRMATION
HIV-1 antibody: NEGATIVE
HIV-2 Ab: NEGATIVE

## 2010-09-20 NOTE — Telephone Encounter (Signed)
Message copied by Beverely Low on Fri Sep 20, 2010  4:10 PM ------      Message from: Staci Righter      Created: Fri Sep 20, 2010  7:47 AM       negative

## 2010-09-20 NOTE — Telephone Encounter (Signed)
Pt aware. Labs mailed, per pt request

## 2010-09-24 ENCOUNTER — Encounter (INDEPENDENT_AMBULATORY_CARE_PROVIDER_SITE_OTHER): Payer: Self-pay | Admitting: Surgery

## 2010-10-09 ENCOUNTER — Encounter: Payer: 59 | Admitting: Internal Medicine

## 2010-10-09 VITALS — BP 130/80 | HR 72 | Temp 98.2°F | Resp 16 | Ht 71.0 in | Wt 218.0 lb

## 2010-10-09 DIAGNOSIS — J309 Allergic rhinitis, unspecified: Secondary | ICD-10-CM

## 2010-10-09 NOTE — Progress Notes (Signed)
  Subjective:    Patient ID: Devin Burton, male    DOB: 05/17/1985, 25 y.o.   MRN: [id number]  HPI error   Review of Systems     Objective:   Physical Exam        Assessment & Plan:

## 2010-10-28 ENCOUNTER — Other Ambulatory Visit: Payer: Self-pay | Admitting: *Deleted

## 2010-10-28 DIAGNOSIS — K219 Gastro-esophageal reflux disease without esophagitis: Secondary | ICD-10-CM

## 2010-10-28 MED ORDER — ESOMEPRAZOLE MAGNESIUM 20 MG PO CPDR: 20.0000 mg | DELAYED_RELEASE_CAPSULE | Freq: Every day | ORAL | Status: DC

## 2010-11-27 ENCOUNTER — Encounter (INDEPENDENT_AMBULATORY_CARE_PROVIDER_SITE_OTHER): Payer: Self-pay | Admitting: Surgery

## 2010-11-28 ENCOUNTER — Ambulatory Visit (INDEPENDENT_AMBULATORY_CARE_PROVIDER_SITE_OTHER): Payer: 59 | Admitting: Surgery

## 2010-11-28 ENCOUNTER — Encounter (INDEPENDENT_AMBULATORY_CARE_PROVIDER_SITE_OTHER): Payer: Self-pay | Admitting: Surgery

## 2010-11-28 VITALS — BP 122/80 | HR 78 | Temp 99.4°F | Ht 73.0 in | Wt 218.0 lb

## 2010-11-28 DIAGNOSIS — L0231 Cutaneous abscess of buttock: Secondary | ICD-10-CM

## 2010-11-28 NOTE — Patient Instructions (Signed)
Daily - after taking a shower, apply some Neosporin ointment to the wounds, then cover with dry 4x4 gauze and tape.

## 2010-11-28 NOTE — Progress Notes (Signed)
I saw this patient in June of this year in [place]. At that time I drained a small superficial left buttock abscess. Initially this healed up but since that time has developed 2 small polyps that are painful and draining some purulent fluid. The patient is not applying any dressing to this area at this time.  In the left buttock near the coccyx there were 2 small superficial abscesses, each measuring about 1 cm across. These are draining small amounts. The drainage seems to be running into his gluteal cleft causing a little bit of skin irritation. He is only tender around the abscesses. We prepped these with Betadine and anesthetized with 1% lidocaine. I unroofed both of these and expressed a little bit of purulent fluid in the previous some old granulation tissue. He we'll treat these with Neosporin and a dry dressing. Followup in 2 weeks.

## 2010-12-18 ENCOUNTER — Ambulatory Visit (INDEPENDENT_AMBULATORY_CARE_PROVIDER_SITE_OTHER): Payer: 59 | Admitting: Surgery

## 2010-12-18 ENCOUNTER — Encounter (INDEPENDENT_AMBULATORY_CARE_PROVIDER_SITE_OTHER): Payer: Self-pay | Admitting: Surgery

## 2010-12-18 VITALS — BP 148/92 | HR 70 | Temp 99.1°F

## 2010-12-18 DIAGNOSIS — L0231 Cutaneous abscess of buttock: Secondary | ICD-10-CM

## 2010-12-18 DIAGNOSIS — L03317 Cellulitis of buttock: Secondary | ICD-10-CM

## 2010-12-18 NOTE — Progress Notes (Signed)
The patient comes back for another wound check. The small area has healed up completely. However the larger more inferior site remains mildly swollen with some purulent drainage. This seems mildly tender. There is no surrounding cellulitis. It does not seem to track very deeply. At this point I would recommend excision of this entire area under anesthesia. He will need to treat this with wet-to-dry dressings until it heals up completely. He should be able to work within a few days even with the dressing changes. I told him that he could work and then come home take a shower and change the dressing at that point. He is in agreement with this plan.The surgical procedure has been discussed with the patient.  Potential risks, benefits, alternative treatments, and expected outcomes have been explained.  All of the patient's questions at this time have been answered.  The patient understand the proposed surgical procedure and wishes to proceed.

## 2010-12-18 NOTE — Patient Instructions (Signed)
We will schedule an outpatient surgery to remove all of the infected tissue and allow it to heal up properly.

## 2010-12-30 ENCOUNTER — Other Ambulatory Visit (INDEPENDENT_AMBULATORY_CARE_PROVIDER_SITE_OTHER): Payer: Self-pay | Admitting: Surgery

## 2010-12-30 DIAGNOSIS — L0501 Pilonidal cyst with abscess: Secondary | ICD-10-CM

## 2010-12-30 HISTORY — PX: PILONIDAL CYST EXCISION: SHX744

## 2011-01-02 ENCOUNTER — Telehealth (INDEPENDENT_AMBULATORY_CARE_PROVIDER_SITE_OTHER): Payer: Self-pay | Admitting: General Surgery

## 2011-01-02 NOTE — Telephone Encounter (Signed)
Pt called stating that Dr.Tsuei had performed surgery on 12/30/10 (pil cystectomy) and was given endocet 5/325 and it wasn't helping with the pain at all even with taking ibuprofen in between the pain med dosage and is requesting something else...please call and advise pt once decision is made..dr.Tsuei is NOT pageable.Marland Kitchenlet

## 2011-01-06 ENCOUNTER — Telehealth (INDEPENDENT_AMBULATORY_CARE_PROVIDER_SITE_OTHER): Payer: Self-pay

## 2011-01-06 NOTE — Telephone Encounter (Signed)
Patient called in saying he called last week for different pain medication and never heard a response. He said he is taking endocet 5/325 but it isn't working. I tried looking for the op note but could not find it so I have no record of any medication being prescribed to the patient. I called his pharmacy and they had no record of the medication. I talked to [facility] and she said I could call in protocol hydrocodone since he had surgery. When I offered that he wanted something stronger than hydrocodone. He said he could wait to see if Tsuei will prescribe something stronger tomorrow.

## 2011-01-07 ENCOUNTER — Other Ambulatory Visit: Payer: Self-pay | Admitting: Internal Medicine

## 2011-01-07 ENCOUNTER — Encounter (INDEPENDENT_AMBULATORY_CARE_PROVIDER_SITE_OTHER): Payer: Self-pay | Admitting: Surgery

## 2011-01-07 NOTE — Telephone Encounter (Signed)
We will prescribe him Dilaudid 2 mg po q4-6 PRN #40 0 refills.

## 2011-01-07 NOTE — Telephone Encounter (Signed)
writtern Rx for Dilaudid 2mg  sig: 1 po q 4-6 prn for pain Disp #40

## 2011-01-13 ENCOUNTER — Encounter (INDEPENDENT_AMBULATORY_CARE_PROVIDER_SITE_OTHER): Payer: Self-pay | Admitting: Surgery

## 2011-01-15 ENCOUNTER — Ambulatory Visit (INDEPENDENT_AMBULATORY_CARE_PROVIDER_SITE_OTHER): Payer: 59 | Admitting: Surgery

## 2011-01-15 ENCOUNTER — Encounter (INDEPENDENT_AMBULATORY_CARE_PROVIDER_SITE_OTHER): Payer: Self-pay | Admitting: Surgery

## 2011-01-15 VITALS — BP 142/94 | HR 76 | Temp 98.1°F | Resp 16 | Ht 71.0 in | Wt 226.0 lb

## 2011-01-15 DIAGNOSIS — L0231 Cutaneous abscess of buttock: Secondary | ICD-10-CM

## 2011-01-15 NOTE — Patient Instructions (Signed)
Continue packing the wound until the packing no longer stays in the wound.  Then switch to Neosporin covered by a dry dressing.  Follow-up 2-3 weeks.

## 2011-01-15 NOTE — Progress Notes (Signed)
S/p wide excision of a left buttock abscess.  This likely represented a pilonidal abscess, as it was located near the coccyx.  The wound is healing well with good granulation tissue.  No surrounding erythema.  Minimal drainage.  The patient returned to work this week.  Continue with dressing changes until the gauze no longer stays in the wound, then switch to Neosporin.  Follow-up 2-3 weeks.

## 2011-01-17 LAB — BASIC METABOLIC PANEL
BUN: 9
CO2: 29
Calcium: 10.1
Chloride: 102
Creatinine, Ser: 1.2
GFR calc Af Amer: >60
GFR calc non Af Amer: >60
Glucose, Bld: 90
Potassium: 4.2
Sodium: 141

## 2011-01-17 LAB — URINALYSIS, ROUTINE W REFLEX MICROSCOPIC
Bilirubin Urine: NEGATIVE
Glucose, UA: NEGATIVE
Hgb urine dipstick: NEGATIVE
Ketones, ur: NEGATIVE
Nitrite: NEGATIVE
Protein, ur: NEGATIVE
Specific Gravity, Urine: 1.008
Urobilinogen, UA: 0.2
pH: 7.5

## 2011-01-17 LAB — CK: Total CK: 209

## 2011-01-27 ENCOUNTER — Encounter (INDEPENDENT_AMBULATORY_CARE_PROVIDER_SITE_OTHER): Payer: Self-pay | Admitting: Surgery

## 2011-01-30 ENCOUNTER — Ambulatory Visit (INDEPENDENT_AMBULATORY_CARE_PROVIDER_SITE_OTHER): Payer: 59 | Admitting: Surgery

## 2011-01-30 ENCOUNTER — Telehealth: Payer: Self-pay | Admitting: *Deleted

## 2011-01-30 ENCOUNTER — Encounter (INDEPENDENT_AMBULATORY_CARE_PROVIDER_SITE_OTHER): Payer: Self-pay | Admitting: Surgery

## 2011-01-30 VITALS — BP 125/79 | HR 61 | Temp 99.8°F | Resp 14 | Ht 71.0 in | Wt 227.0 lb

## 2011-01-30 DIAGNOSIS — L0231 Cutaneous abscess of buttock: Secondary | ICD-10-CM

## 2011-01-30 NOTE — Telephone Encounter (Signed)
Patient called and left voice message requesting a return phone call regarding information that was placed in his chart.

## 2011-01-30 NOTE — Patient Instructions (Signed)
Dry gauze.  Follow-up PRN

## 2011-01-30 NOTE — Progress Notes (Signed)
01/30/11  The wound is almost completely healed.  Small amount of granulation tissue cauterized with silver nitrate.  Allow this to heal and follow-up PRN   01/15/11  S/p wide excision of a left buttock abscess.  This likely represented a pilonidal abscess, as it was located near the coccyx.  The wound is healing well with good granulation tissue.  No surrounding erythema.  Minimal drainage.  The patient returned to work this week.  Continue with dressing changes until the gauze no longer stays in the wound, then switch to Neosporin.  Follow-up 2-3 weeks.

## 2011-01-31 NOTE — Telephone Encounter (Signed)
Call placed to patient at [phone], patient was seen to [facility] yesterday, and he stated on his problem list it stated that he was exposed to HIV. Patient stated that he was not exposed to HIV he had HIV testing done. He has requested that his chart be corrected to show he was not exposed.

## 2011-01-31 NOTE — Telephone Encounter (Signed)
Call returned to patient at [phone], no answer. A detailed voice message was left informing patient per Dr Rodena Medin instruction. Message left informing patient to call back is any additional questions or concerns.

## 2011-01-31 NOTE — Telephone Encounter (Signed)
They need to read the notes specifically.  1) that is not on his problem list and never was 2) he previously requested hiv testing because a family member has hiv. i documented that he had no direct contact. 3) all lab tests require a numeric code as a reason for obtaining the lab. That code is the closest reason in his situation.  4) there is nothing to remove as it was a one time insurance code for the lab only.

## 2011-02-06 ENCOUNTER — Emergency Department (HOSPITAL_COMMUNITY)
Admission: EM | Admit: 2011-02-06 | Discharge: 2011-02-06 | Disposition: A | Discharge status: Home / Self Care | Payer: 59 | Attending: Emergency Medicine | Admitting: Emergency Medicine

## 2011-02-06 ENCOUNTER — Telehealth: Payer: Self-pay | Admitting: *Deleted

## 2011-02-06 DIAGNOSIS — R42 Dizziness and giddiness: Secondary | ICD-10-CM | POA: Insufficient documentation

## 2011-02-06 DIAGNOSIS — R071 Chest pain on breathing: Secondary | ICD-10-CM | POA: Insufficient documentation

## 2011-02-06 LAB — POCT I-STAT, CHEM 8
BUN: 8 mg/dL (ref 6–23)
Calcium, Ion: 1.17 mmol/L (ref 1.12–1.32)
Chloride: 105 mEq/L (ref 96–112)
Creatinine, Ser: 1.2 mg/dL (ref 0.50–1.35)
Glucose, Bld: 93 mg/dL (ref 70–99)
HCT: 49 % (ref 39.0–52.0)
Hemoglobin: 16.7 g/dL (ref 13.0–17.0)
Potassium: 3.9 mEq/L (ref 3.5–5.1)
Sodium: 140 mEq/L (ref 135–145)
TCO2: 24 mmol/L (ref 0–100)

## 2011-02-06 NOTE — Telephone Encounter (Signed)
Left voice mail on Triage to make appt for Hypertension.  LMTCB.

## 2011-02-07 ENCOUNTER — Encounter: Payer: Self-pay | Admitting: Internal Medicine

## 2011-02-07 ENCOUNTER — Ambulatory Visit (INDEPENDENT_AMBULATORY_CARE_PROVIDER_SITE_OTHER): Payer: 59 | Admitting: Internal Medicine

## 2011-02-07 VITALS — BP 138/90 | Temp 98.7°F | Wt 220.0 lb

## 2011-02-07 DIAGNOSIS — Z23 Encounter for immunization: Secondary | ICD-10-CM

## 2011-02-07 DIAGNOSIS — F341 Dysthymic disorder: Secondary | ICD-10-CM

## 2011-02-07 DIAGNOSIS — Z0000 Encounter for general adult medical examination without abnormal findings: Secondary | ICD-10-CM

## 2011-02-07 DIAGNOSIS — K219 Gastro-esophageal reflux disease without esophagitis: Secondary | ICD-10-CM

## 2011-02-07 DIAGNOSIS — F329 Major depressive disorder, single episode, unspecified: Secondary | ICD-10-CM

## 2011-02-07 MED ORDER — ESOMEPRAZOLE MAGNESIUM 20 MG PO CPDR: 20.0000 mg | DELAYED_RELEASE_CAPSULE | Freq: Every day | ORAL | Status: DC

## 2011-02-07 MED ORDER — SERTRALINE HCL 50 MG PO TABS: 50.0000 mg | ORAL_TABLET | Freq: Every day | ORAL | Status: DC

## 2011-02-07 NOTE — Patient Instructions (Signed)
It is important that you exercise regularly, at least 20 minutes 3 to 4 times per week.  If you develop chest pain or shortness of breath seek  medical attention.  Return office visit 4 weeks

## 2011-02-07 NOTE — Progress Notes (Signed)
  Subjective:    Patient ID: Devin Burton, male    DOB: March 10, 1986, 25 y.o.   MRN: [id number]  HPI  25 year old patient who is seen today with blood pressure concerns. He says his blood pressure has been elevated at least for the past 4 weeks. There has been some situational anxiety and depression since the death of his father on 26-Jul-2024primary CNS neoplasm. Blood pressure has been consistently elevated and he was evaluated at [facility] along ED yesterday do to some sharp anterior chest pain an EKG was normal. He is on Nexium for gastroesophageal reflux disease. He complains of increasing depression.    Review of Systems  Constitutional: Negative for fever, chills, appetite change and fatigue.  HENT: Negative for hearing loss, ear pain, congestion, sore throat, trouble swallowing, neck stiffness, dental problem, voice change and tinnitus.   Eyes: Negative for pain, discharge and visual disturbance.  Respiratory: Negative for cough, chest tightness, wheezing and stridor.   Cardiovascular: Negative for chest pain, palpitations and leg swelling.  Gastrointestinal: Negative for nausea, vomiting, abdominal pain, diarrhea, constipation, blood in stool and abdominal distention.  Genitourinary: Negative for urgency, hematuria, flank pain, discharge, difficulty urinating and genital sores.  Musculoskeletal: Negative for myalgias, back pain, joint swelling, arthralgias and gait problem.  Skin: Negative for rash.  Neurological: Negative for dizziness, syncope, speech difficulty, weakness, numbness and headaches.  Hematological: Negative for adenopathy. Does not bruise/bleed easily.  Psychiatric/Behavioral: Positive for sleep disturbance and dysphoric mood. Negative for behavioral problems. The patient is nervous/anxious.        Objective:   Physical Exam  Constitutional: He is oriented to person, place, and time. He appears well-developed and well-nourished. No distress.  HENT:  Head: Normocephalic.    Right Ear: External ear normal.  Left Ear: External ear normal.  Eyes: Conjunctivae and EOM are normal.  Neck: Normal range of motion.  Cardiovascular: Normal rate and normal heart sounds.   Pulmonary/Chest: Breath sounds normal.  Abdominal: Bowel sounds are normal.  Musculoskeletal: Normal range of motion. He exhibits no edema and no tenderness.  Neurological: He is alert and oriented to person, place, and time.  Psychiatric: His behavior is normal.       Dysphoric mood          Assessment & Plan:   Situational depression. He feels that he might benefit from medication. We'll place on sertraline 50 mg daily and recheck in 4 weeks. Labile hypertension. We'll continue to observe off medication. Blood pressure today 140/90

## 2011-03-10 ENCOUNTER — Ambulatory Visit: Payer: 59 | Admitting: Internal Medicine

## 2011-03-11 ENCOUNTER — Ambulatory Visit (INDEPENDENT_AMBULATORY_CARE_PROVIDER_SITE_OTHER): Payer: 59 | Admitting: Surgery

## 2011-03-11 ENCOUNTER — Encounter (INDEPENDENT_AMBULATORY_CARE_PROVIDER_SITE_OTHER): Payer: Self-pay | Admitting: Surgery

## 2011-03-11 VITALS — BP 156/102 | HR 60 | Temp 99.2°F | Resp 18 | Ht 71.0 in | Wt 228.0 lb

## 2011-03-11 DIAGNOSIS — L0231 Cutaneous abscess of buttock: Secondary | ICD-10-CM

## 2011-03-11 NOTE — Patient Instructions (Signed)
1.  Soak buttocks 2 to 3 times per day.  2.  To see Dr. Corliss Skains in 1 week.

## 2011-03-11 NOTE — Progress Notes (Signed)
[facility]  Ovidio Kin, MD,  FACS [address]  [address] [place], [place]    [address] Phone:  [phone] FAX:  [phone]   Re:   Devin Burton DOB:   10-Jan-1986 MRN:   [id number]  Urgent Office  ASSESSMENT AND PLAN: 1.  Recurrent left buttocks infection.  Recurrent left buttocks abscess.  Spontaneously drained at home.  Patient wants no further surgery today.  Will do local wound care with sitz baths BID.  He does no need antibiotics.  To see Dr. Corliss Skains next week.  HISTORY OF PRESENT ILLNESS: Chief Complaint  Patient presents with  . Routine Post Op    recheck pilonidal cyst pt states that is opened back up and draining     Devin Burton is a 25 y.o. (DOB: 1985/07/29)  white male who is a patient of Carrie Mew, MD and comes to me today for evaluate ? Recurrent pilonidal..  Seen originally by Dr. Corliss Skains in June, 2012, at [place] office.  This was excised at the [facility] about 2 1/2 months ago and left to heal by secondary intention.  (I do not have an op note from the Surgical Center.)  Last seen by Dr. Corliss Skains 01/30/2011.  It seems the wound was nearly healed at that time.  This area became swollen and drained 03/02/2011 on its own.  It is sore, still draining a little, and Dr. Corliss Skains is on vacation.  So he was worked in to see the Public relations account executive.  He has had no fever or other problems.  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  He is studying to become a Company secretary.  PHYSICAL EXAM: BP 156/102  Pulse 60  Temp(Src) 99.2 F (37.3 C) (Temporal)  Resp 18  Ht 5\' 11"  (1.803 m)  Wt 228 lb (103.42 kg)  BMI 31.80 kg/m2  Buttocks:  On left buttocks (about 2 cm off midline), 3 cm scar with central draining sinus.  Recurrent abscess.  Patient did not want further I&D today.  And actually, I think that most of the pus is drained.  Minimal cellulitis.  DATA REVIEWED: Past history in chart  Ovidio Kin, MD,  FACS Office:  [phone]

## 2011-03-17 ENCOUNTER — Ambulatory Visit (INDEPENDENT_AMBULATORY_CARE_PROVIDER_SITE_OTHER): Payer: 59 | Admitting: Family Medicine

## 2011-03-17 ENCOUNTER — Telehealth (INDEPENDENT_AMBULATORY_CARE_PROVIDER_SITE_OTHER): Payer: Self-pay | Admitting: Surgery

## 2011-03-17 ENCOUNTER — Encounter: Payer: Self-pay | Admitting: Family Medicine

## 2011-03-17 VITALS — BP 118/82 | Temp 98.7°F | Wt 233.0 lb

## 2011-03-17 DIAGNOSIS — J209 Acute bronchitis, unspecified: Secondary | ICD-10-CM

## 2011-03-17 MED ORDER — DOXYCYCLINE HYCLATE 100 MG PO TABS: 100.0000 mg | ORAL_TABLET | Freq: Two times a day (BID) | ORAL | Status: AC

## 2011-03-17 MED ORDER — HYDROCODONE-HOMATROPINE 5-1.5 MG/5ML PO SYRP: 5.0000 mL | ORAL_SOLUTION | Freq: Four times a day (QID) | ORAL | Status: AC | PRN

## 2011-03-17 NOTE — Patient Instructions (Signed)
Follow up promptly for any worsening fever or worsening symptoms.

## 2011-03-17 NOTE — Telephone Encounter (Signed)
Pt was seen by Dr. Kem Parkinson in Urgent office and was told to call back and make an appt w/Dr. MT this week, please call.

## 2011-03-17 NOTE — Progress Notes (Signed)
  Subjective:    Patient ID: Devin Burton, male    DOB: [birth year], 25 y.o.   MRN: [id number]  HPI  Acute visit. Onset about one week of rhinorrhea, chills, night sweats, sore throat, body aches, nasal congestion, and now productive cough with yellow sputum. Sore throat symptoms are slightly better. Nonsmoker. No history of asthma. Denies any nausea, vomiting, or diarrhea. Significant nighttime cough not relieved with over-the-counter cough medicines   Review of Systems As per history of present illness    Objective:   Physical Exam  Constitutional: He appears well-developed and well-nourished. No distress.  HENT:  Right Ear: External ear normal.  Left Ear: External ear normal.  Mouth/Throat: Oropharynx is clear and moist.  Neck: Neck supple.  Cardiovascular: Normal rate and regular rhythm.   Pulmonary/Chest: Effort normal and breath sounds normal. No respiratory distress. He has no wheezes. He has no rales.  Lymphadenopathy:    He has no cervical adenopathy.          Assessment & Plan:  Acute bronchitis. Hycodan for nighttime cough. If any fever or persistent productive cough start doxycycline 100 mg twice daily for 10 days

## 2011-03-19 ENCOUNTER — Ambulatory Visit (INDEPENDENT_AMBULATORY_CARE_PROVIDER_SITE_OTHER): Payer: 59 | Admitting: Surgery

## 2011-03-19 ENCOUNTER — Encounter (INDEPENDENT_AMBULATORY_CARE_PROVIDER_SITE_OTHER): Payer: Self-pay | Admitting: Surgery

## 2011-03-19 VITALS — BP 132/86 | HR 68 | Temp 97.6°F | Resp 14 | Ht 71.0 in | Wt 224.0 lb

## 2011-03-19 DIAGNOSIS — L0231 Cutaneous abscess of buttock: Secondary | ICD-10-CM

## 2011-03-19 NOTE — Progress Notes (Signed)
Filed Vitals:   03/19/11 1416  BP: 132/86  Pulse: 68  Temp: 97.6 F (36.4 C)  Resp: 14   The patient had some bloody drainage from his previous pilonidal site last week.  Since then, it has stopped draining.  He denies any pain, even before the drainage started.    On examination, the scar seems mildly fluctuant in the center.  No drainage or opening noted.  I prepped this with Betadine and anesthetized with 1% Lidocaine.  I made a round 1 cm incision and drained some old bloody looking fluid.  The wound was probed with a hemostat and seems to track about 2 cm toward the midline.  Some old chronic granulation tissue was debrided.  The wound was packed with 1/2 inch nu-gauze and covered with a dry dressing.  He will remove the packing in 24 hours and then treat the wound with neosporin.  Follow-up in 1 week.

## 2011-03-19 NOTE — Patient Instructions (Signed)
Remove packing in 24 hours, then treat the area with neosporin ointment and cover with dry gauze and tape.  Follow-up 1 week.

## 2011-03-28 ENCOUNTER — Encounter (INDEPENDENT_AMBULATORY_CARE_PROVIDER_SITE_OTHER): Payer: Self-pay | Admitting: Surgery

## 2011-03-28 ENCOUNTER — Ambulatory Visit (INDEPENDENT_AMBULATORY_CARE_PROVIDER_SITE_OTHER): Payer: 59 | Admitting: Surgery

## 2011-03-28 VITALS — BP 130/76 | HR 68 | Temp 96.9°F | Resp 18 | Ht 71.0 in | Wt 225.0 lb

## 2011-03-28 DIAGNOSIS — L03317 Cellulitis of buttock: Secondary | ICD-10-CM

## 2011-03-28 DIAGNOSIS — L0231 Cutaneous abscess of buttock: Secondary | ICD-10-CM

## 2011-03-28 NOTE — Progress Notes (Signed)
Filed Vitals:   03/28/11 1433  BP: 130/76  Pulse: 68  Temp: 96.9 F (36.1 C)  Resp: 18   The wound seems to have healed up.  No drainage. No fluctuance.  Non-tender.    Follow-up PRN  Wilmon Arms. Corliss Skains, MD, [facility]  03/28/2011 2:43 PM

## 2011-04-01 ENCOUNTER — Encounter (INDEPENDENT_AMBULATORY_CARE_PROVIDER_SITE_OTHER): Payer: Self-pay | Admitting: Surgery

## 2011-04-01 ENCOUNTER — Ambulatory Visit (INDEPENDENT_AMBULATORY_CARE_PROVIDER_SITE_OTHER): Payer: 59 | Admitting: Surgery

## 2011-04-01 VITALS — BP 168/108 | HR 60 | Temp 99.2°F | Resp 16 | Ht 71.0 in | Wt 225.2 lb

## 2011-04-01 DIAGNOSIS — L0291 Cutaneous abscess, unspecified: Secondary | ICD-10-CM | POA: Insufficient documentation

## 2011-04-01 NOTE — Patient Instructions (Signed)

## 2011-04-01 NOTE — Progress Notes (Signed)
Subjective:     Patient ID: Devin Burton, male   DOB: 12-Mar-1986, 25 y.o.   MRN: [id number]  HPI Patient presents to 2 swelling just left to his gluteal crease near the coccyx. He has been followed for chronic abscess by Dr. Corliss Skains and has had previous drainage procedures are done before. He received 3 days ago but her the weekend developed more swelling redness or drainage at the site.  Review of Systems  Constitutional: Positive for fever and chills.  HENT: Negative.   Eyes: Negative.   Respiratory: Negative.   Cardiovascular: Negative.        Objective:   Physical Exam  Constitutional: He appears well-developed and well-nourished.  HENT:  Head: Normocephalic and atraumatic.  Neurological: He is alert.  Skin:       Just left of midline of the gluteal cleft is a 1 x 1 x 1 cm area of redness and fluctuance. Consistent with abscess.  Psychiatric: He has a normal mood and affect. His behavior is normal. Judgment and thought content normal.       Assessment:     Recurrent left blocked abscess adjacent to gluteal cleft    Plan:     I recommended incision and drainage today in the office. The patient agrees. Under sterile conditions the area was prepped. 1% lidocaine with epinephrine was used injected around the abscess cavity. Scalpel was used to create a cruciate incision. There is minimal pus. This was 5 mm deep. A clean dressing was applied. I've asked him to changes daily. Showers. Will place on doxycycline 100 mg p.o. b.i.d. and return to see Dr. Corliss Skains next week.

## 2011-04-07 ENCOUNTER — Encounter (HOSPITAL_COMMUNITY): Payer: Self-pay

## 2011-04-07 ENCOUNTER — Encounter (INDEPENDENT_AMBULATORY_CARE_PROVIDER_SITE_OTHER): Payer: Self-pay | Admitting: Surgery

## 2011-04-07 ENCOUNTER — Ambulatory Visit (INDEPENDENT_AMBULATORY_CARE_PROVIDER_SITE_OTHER): Payer: 59 | Admitting: Surgery

## 2011-04-07 ENCOUNTER — Encounter (HOSPITAL_COMMUNITY): Payer: Self-pay | Admitting: Pharmacy Technician

## 2011-04-07 VITALS — BP 138/100 | HR 80 | Temp 98.1°F | Resp 16 | Ht 71.0 in | Wt 227.1 lb

## 2011-04-07 DIAGNOSIS — L0231 Cutaneous abscess of buttock: Secondary | ICD-10-CM

## 2011-04-07 MED ORDER — CIPROFLOXACIN IN D5W 400 MG/200ML IV SOLN: 400.0000 mg | INTRAVENOUS | Status: DC

## 2011-04-07 NOTE — Progress Notes (Signed)
Patient ID: Devin Burton, male   DOB: 08/05/1985, 25 y.o.   MRN: [id number]  Chief Complaint  Patient presents with  . Routine Post Op    recheck abscess     HPI Devin Burton is a 25 y.o. male.   HPI He has had a chronic problem with a draining abscess near his coccyx for some time.  He underwent surgery in 9/12 for wide excision of the pilonidal abscess.  Initially this healed up well, but over the last few weeks, he has had recurrent swelling and drainage in this area.  He presents now to discuss definitive treatment. Past Medical History  Diagnosis Date  . ALLERGIC RHINITIS 05/28/2007  . GERD (gastroesophageal reflux disease)   . Hemorrhoid   . Abscess     left buttock  . Pilonidal cyst     Past Surgical History  Procedure Date  . Fracture surgery 2009    right wrist  . Tonsillectomy   . Pilonidal cyst excision 12/30/10    Family History  Problem Relation Age of Onset  . HIV Mother   . HIV Father   . Colon cancer Neg Hx     Social History History  Substance Use Topics  . Smoking status: Never Smoker   . Smokeless tobacco: Never Used  . Alcohol Use: Yes     3 daily     Allergies  Allergen Reactions  . Clarithromycin     REACTION: SOB, Palpitations  . Sulfamethoxazole W/Trimethoprim     Patient unsure of reaction.  . Penicillins Swelling    Of throat    Current Outpatient Prescriptions  Medication Sig Dispense Refill  . doxycycline (VIBRAMYCIN) 100 MG capsule Take 100 mg by mouth 2 (two) times daily.        . esomeprazole (NEXIUM) 20 MG capsule Take 1 capsule (20 mg total) by mouth daily.  90 capsule  1   No current facility-administered medications for this visit.   Facility-Administered Medications Ordered in Other Visits  Medication Dose Route Frequency Provider Last Rate Last Dose  . DISCONTD: ciprofloxacin (CIPRO) IVPB 400 mg  400 mg Intravenous 120 min pre-op Felicity Penix K. Debbra Digiulio, MD        Review of Systems Review of Systems ROS positive  for buttock pain near the abscess, otherwise negative Blood pressure 138/100, pulse 80, temperature 98.1 F (36.7 C), temperature source Temporal, resp. rate 16, height 5' 11" (1.803 m), weight 227 lb 2 oz (103.023 kg).  Physical Exam Physical Exam WDWN in NAD Skin - left of coccyx 3-4 cm of scarring with some small openings draining some purulent fluid.  No surrounding cellulitis  Data Reviewed   Assessment    Chronic pilonidal abscess      Plan    Wide excision of chronic pilonidal abscess.  The surgical procedure has been discussed with the patient.  Potential risks, benefits, alternative treatments, and expected outcomes have been explained.  All of the patient's questions at this time have been answered.  The likelihood of reaching the patient's treatment goal is good.  The patient understand the proposed surgical procedure and wishes to proceed.        Hearl Heikes K. 04/07/2011, 4:03 PM    

## 2011-04-08 ENCOUNTER — Ambulatory Visit (HOSPITAL_COMMUNITY): Payer: 59 | Admitting: Anesthesiology

## 2011-04-08 ENCOUNTER — Other Ambulatory Visit (INDEPENDENT_AMBULATORY_CARE_PROVIDER_SITE_OTHER): Payer: Self-pay | Admitting: Surgery

## 2011-04-08 ENCOUNTER — Encounter (HOSPITAL_COMMUNITY): Payer: Self-pay | Admitting: Anesthesiology

## 2011-04-08 ENCOUNTER — Encounter (HOSPITAL_COMMUNITY)
Admission: RE | Disposition: A | Discharge status: Home / Self Care | Payer: Self-pay | Source: Ambulatory Visit | Attending: Surgery

## 2011-04-08 ENCOUNTER — Encounter (HOSPITAL_COMMUNITY): Payer: Self-pay | Admitting: *Deleted

## 2011-04-08 ENCOUNTER — Ambulatory Visit (HOSPITAL_COMMUNITY)
Admission: RE | Admit: 2011-04-08 | Discharge: 2011-04-08 | Disposition: A | Discharge status: Home / Self Care | Payer: 59 | Source: Ambulatory Visit | Attending: Surgery | Admitting: Surgery

## 2011-04-08 DIAGNOSIS — K219 Gastro-esophageal reflux disease without esophagitis: Secondary | ICD-10-CM | POA: Insufficient documentation

## 2011-04-08 DIAGNOSIS — L0501 Pilonidal cyst with abscess: Secondary | ICD-10-CM

## 2011-04-08 DIAGNOSIS — F3289 Other specified depressive episodes: Secondary | ICD-10-CM | POA: Insufficient documentation

## 2011-04-08 DIAGNOSIS — R51 Headache: Secondary | ICD-10-CM | POA: Insufficient documentation

## 2011-04-08 DIAGNOSIS — F329 Major depressive disorder, single episode, unspecified: Secondary | ICD-10-CM | POA: Insufficient documentation

## 2011-04-08 HISTORY — DX: Pneumonia, unspecified organism: J18.9

## 2011-04-08 HISTORY — PX: PILONIDAL CYST EXCISION: SHX744

## 2011-04-08 HISTORY — DX: Headache: R51

## 2011-04-08 HISTORY — DX: Adverse effect of unspecified anesthetic, initial encounter: T41.45XA

## 2011-04-08 LAB — CBC
Hemoglobin: 14.9 g/dL (ref 13.0–17.0)
MCV: 87.9 fL (ref 78.0–100.0)
Platelets: 245 10*3/uL (ref 150–400)
RBC: 5.12 MIL/uL (ref 4.22–5.81)
WBC: 5.9 10*3/uL (ref 4.0–10.5)

## 2011-04-08 SURGERY — EXCISION, SIMPLE PILONIDAL CYST
Anesthesia: General | Site: Buttocks | Wound class: Dirty or Infected

## 2011-04-08 MED ORDER — FENTANYL CITRATE 0.05 MG/ML IJ SOLN
INTRAMUSCULAR | Status: DC | PRN
  Administered 2011-04-08: 100 ug via INTRAVENOUS

## 2011-04-08 MED ORDER — MEPERIDINE HCL 25 MG/ML IJ SOLN: 6.2500 mg | INTRAMUSCULAR | Status: DC | PRN

## 2011-04-08 MED ORDER — OXYCODONE-ACETAMINOPHEN 5-325 MG PO TABS: 1.0000 | ORAL_TABLET | ORAL | Status: AC | PRN

## 2011-04-08 MED ORDER — LACTATED RINGERS IV SOLN
INTRAVENOUS | Status: DC | PRN
  Administered 2011-04-08: 10:00:00 via INTRAVENOUS

## 2011-04-08 MED ORDER — MUPIROCIN 2 % EX OINT
TOPICAL_OINTMENT | Freq: Two times a day (BID) | CUTANEOUS | Status: DC
  Administered 2011-04-08: 1 via NASAL

## 2011-04-08 MED ORDER — NEOSTIGMINE METHYLSULFATE 1 MG/ML IJ SOLN
INTRAMUSCULAR | Status: DC | PRN
  Administered 2011-04-08: 4 mg via INTRAVENOUS

## 2011-04-08 MED ORDER — PROPOFOL 10 MG/ML IV EMUL
INTRAVENOUS | Status: DC | PRN
  Administered 2011-04-08: 200 mg via INTRAVENOUS

## 2011-04-08 MED ORDER — MIDAZOLAM HCL 5 MG/5ML IJ SOLN
INTRAMUSCULAR | Status: DC | PRN
  Administered 2011-04-08: 2 mg via INTRAVENOUS

## 2011-04-08 MED ORDER — 0.9 % SODIUM CHLORIDE (POUR BTL) OPTIME
TOPICAL | Status: DC | PRN
  Administered 2011-04-08: 1000 mL

## 2011-04-08 MED ORDER — MUPIROCIN 2 % EX OINT
TOPICAL_OINTMENT | CUTANEOUS | Status: AC
  Administered 2011-04-08: 1 via NASAL
  Filled 2011-04-08: qty 22

## 2011-04-08 MED ORDER — GLYCOPYRROLATE 0.2 MG/ML IJ SOLN
INTRAMUSCULAR | Status: DC | PRN
  Administered 2011-04-08: .6 mg via INTRAVENOUS

## 2011-04-08 MED ORDER — PROMETHAZINE HCL 25 MG/ML IJ SOLN: 6.2500 mg | INTRAMUSCULAR | Status: DC | PRN

## 2011-04-08 MED ORDER — BUPIVACAINE-EPINEPHRINE 0.25% -1:200000 IJ SOLN
INTRAMUSCULAR | Status: DC | PRN
  Administered 2011-04-08: 10 mL

## 2011-04-08 MED ORDER — ONDANSETRON HCL 4 MG/2ML IJ SOLN
INTRAMUSCULAR | Status: DC | PRN
  Administered 2011-04-08: 4 mg via INTRAVENOUS

## 2011-04-08 MED ORDER — PROMETHAZINE HCL 25 MG/ML IJ SOLN: 12.5000 mg | Freq: Four times a day (QID) | INTRAMUSCULAR | Status: DC | PRN

## 2011-04-08 MED ORDER — CIPROFLOXACIN IN D5W 400 MG/200ML IV SOLN
INTRAVENOUS | Status: AC
  Filled 2011-04-08: qty 200

## 2011-04-08 MED ORDER — HYDROMORPHONE HCL PF 1 MG/ML IJ SOLN
0.2500 mg | INTRAMUSCULAR | Status: DC | PRN
  Administered 2011-04-08 (×2): 0.5 mg via INTRAVENOUS

## 2011-04-08 MED ORDER — LACTATED RINGERS IV SOLN
INTRAVENOUS | Status: DC
Start: 2011-04-08 — End: 2011-04-08
  Administered 2011-04-08: 09:00:00 via INTRAVENOUS

## 2011-04-08 MED ORDER — ROCURONIUM BROMIDE 100 MG/10ML IV SOLN
INTRAVENOUS | Status: DC | PRN
  Administered 2011-04-08: 30 mg via INTRAVENOUS

## 2011-04-08 SURGICAL SUPPLY — 39 items
APL SKNCLS STERI-STRIP NONHPOA (GAUZE/BANDAGES/DRESSINGS) ×1
BENZOIN TINCTURE PRP APPL 2/3 (GAUZE/BANDAGES/DRESSINGS) ×2 IMPLANT
BLADE SURG ROTATE 9660 (MISCELLANEOUS) IMPLANT
CANISTER SUCTION 2500CC (MISCELLANEOUS) ×2 IMPLANT
CLOTH BEACON ORANGE TIMEOUT ST (SAFETY) ×2 IMPLANT
COVER SURGICAL LIGHT HANDLE (MISCELLANEOUS) ×2 IMPLANT
DRAPE LAPAROTOMY T 102X78X121 (DRAPES) ×2 IMPLANT
DRAPE UTILITY 15X26 W/TAPE STR (DRAPE) ×4 IMPLANT
ELECT CAUTERY BLADE 6.4 (BLADE) ×2 IMPLANT
ELECT REM PT RETURN 9FT ADLT (ELECTROSURGICAL) ×2
ELECTRODE REM PT RTRN 9FT ADLT (ELECTROSURGICAL) ×1 IMPLANT
GAUZE XEROFORM 1X8 LF (GAUZE/BANDAGES/DRESSINGS) ×2 IMPLANT
GLOVE BIO SURGEON STRL SZ7 (GLOVE) ×2 IMPLANT
GLOVE BIOGEL PI IND STRL 6.5 (GLOVE) ×1 IMPLANT
GLOVE BIOGEL PI IND STRL 7.5 (GLOVE) ×1 IMPLANT
GLOVE BIOGEL PI INDICATOR 6.5 (GLOVE) ×1
GLOVE BIOGEL PI INDICATOR 7.5 (GLOVE) ×1
GLOVE SURG SS PI 6.5 STRL IVOR (GLOVE) ×2 IMPLANT
GOWN STRL NON-REIN LRG LVL3 (GOWN DISPOSABLE) ×4 IMPLANT
KIT BASIN OR (CUSTOM PROCEDURE TRAY) ×2 IMPLANT
KIT ROOM TURNOVER OR (KITS) ×2 IMPLANT
NEEDLE HYPO 25GX1X1/2 BEV (NEEDLE) ×2 IMPLANT
NS IRRIG 1000ML POUR BTL (IV SOLUTION) ×2 IMPLANT
PACK SURGICAL SETUP 50X90 (CUSTOM PROCEDURE TRAY) ×2 IMPLANT
PAD ARMBOARD 7.5X6 YLW CONV (MISCELLANEOUS) ×4 IMPLANT
PENCIL BUTTON HOLSTER BLD 10FT (ELECTRODE) ×2 IMPLANT
SPECIMEN JAR SMALL (MISCELLANEOUS) ×2 IMPLANT
SPONGE GAUZE 4X4 12PLY (GAUZE/BANDAGES/DRESSINGS) ×2 IMPLANT
SPONGE LAP 18X18 X RAY DECT (DISPOSABLE) ×2 IMPLANT
SUT ETHILON 2 0 FS 18 (SUTURE) IMPLANT
SUT VIC AB 3-0 SH 18 (SUTURE) IMPLANT
SYR BULB 3OZ (MISCELLANEOUS) ×2 IMPLANT
SYR CONTROL 10ML LL (SYRINGE) ×2 IMPLANT
TAPE CLOTH SURG 4X10 WHT LF (GAUZE/BANDAGES/DRESSINGS) ×2 IMPLANT
TOWEL OR 17X24 6PK STRL BLUE (TOWEL DISPOSABLE) ×2 IMPLANT
TOWEL OR 17X26 10 PK STRL BLUE (TOWEL DISPOSABLE) ×2 IMPLANT
TUBE CONNECTING 12X1/4 (SUCTIONS) ×2 IMPLANT
WATER STERILE IRR 1000ML POUR (IV SOLUTION) IMPLANT
YANKAUER SUCT BULB TIP NO VENT (SUCTIONS) ×2 IMPLANT

## 2011-04-08 NOTE — Anesthesia Preprocedure Evaluation (Addendum)
Anesthesia Evaluation  Patient identified by MRN, date of birth, ID band Patient awake    Reviewed: Allergy & Precautions, H&P , NPO status , Patient's Chart, lab work & pertinent test results  Airway Mallampati: I TM Distance: >3 FB Neck ROM: full    Dental No notable dental hx. (+) Teeth Intact and Dental Advisory Given   Pulmonary neg pulmonary ROS, pneumonia ,  clear to auscultation  Pulmonary exam normal       Cardiovascular neg cardio ROS regular Normal    Neuro/Psych  Headaches, PSYCHIATRIC DISORDERS Depression Negative Neurological ROS  Negative Psych ROS   GI/Hepatic Neg liver ROS, GERD-  Controlled and Medicated,  Endo/Other  Negative Endocrine ROS  Renal/GU negative Renal ROS  Genitourinary negative   Musculoskeletal   Abdominal   Peds  Hematology negative hematology ROS (+)   Anesthesia Other Findings   Reproductive/Obstetrics negative OB ROS                          Anesthesia Physical Anesthesia Plan  ASA: II  Anesthesia Plan: General   Post-op Pain Management:    Induction: Intravenous  Airway Management Planned: Oral ETT  Additional Equipment:   Intra-op Plan:   Post-operative Plan: Extubation in OR  Informed Consent: I have reviewed the patients History and Physical, chart, labs and discussed the procedure including the risks, benefits and alternatives for the proposed anesthesia with the patient or authorized representative who has indicated his/her understanding and acceptance.     Plan Discussed with: CRNA  Anesthesia Plan Comments:         Anesthesia Quick Evaluation

## 2011-04-08 NOTE — H&P (View-Only) (Signed)
Patient ID: Devin Burton, male   DOB: 04/20/1986, 25 y.o.   MRN: [id number]  Chief Complaint  Patient presents with  . Routine Post Op    recheck abscess     HPI Devin Burton is a 25 y.o. male.   HPI He has had a chronic problem with a draining abscess near his coccyx for some time.  He underwent surgery in 9/12 for wide excision of the pilonidal abscess.  Initially this healed up well, but over the last few weeks, he has had recurrent swelling and drainage in this area.  He presents now to discuss definitive treatment. Past Medical History  Diagnosis Date  . ALLERGIC RHINITIS 05/28/2007  . GERD (gastroesophageal reflux disease)   . Hemorrhoid   . Abscess     left buttock  . Pilonidal cyst     Past Surgical History  Procedure Date  . Fracture surgery 2009    right wrist  . Tonsillectomy   . Pilonidal cyst excision 12/30/10    Family History  Problem Relation Age of Onset  . HIV Mother   . HIV Father   . Colon cancer Neg Hx     Social History History  Substance Use Topics  . Smoking status: Never Smoker   . Smokeless tobacco: Never Used  . Alcohol Use: Yes     3 daily     Allergies  Allergen Reactions  . Clarithromycin     REACTION: SOB, Palpitations  . Sulfamethoxazole W/Trimethoprim     Patient unsure of reaction.  Marland Kitchen Penicillins Swelling    Of throat    Current Outpatient Prescriptions  Medication Sig Dispense Refill  . doxycycline (VIBRAMYCIN) 100 MG capsule Take 100 mg by mouth 2 (two) times daily.        Marland Kitchen esomeprazole (NEXIUM) 20 MG capsule Take 1 capsule (20 mg total) by mouth daily.  90 capsule  1   No current facility-administered medications for this visit.   Facility-Administered Medications Ordered in Other Visits  Medication Dose Route Frequency Provider Last Rate Last Dose  . DISCONTD: ciprofloxacin (CIPRO) IVPB 400 mg  400 mg Intravenous 120 min pre-op Wilmon Arms. Wakeelah Solan, MD        Review of Systems Review of Systems ROS positive  for buttock pain near the abscess, otherwise negative Blood pressure 138/100, pulse 80, temperature 98.1 F (36.7 C), temperature source Temporal, resp. rate 16, height 5\' 11"  (1.803 m), weight 227 lb 2 oz (103.023 kg).  Physical Exam Physical Exam WDWN in NAD Skin - left of coccyx 3-4 cm of scarring with some small openings draining some purulent fluid.  No surrounding cellulitis  Data Reviewed   Assessment    Chronic pilonidal abscess      Plan    Wide excision of chronic pilonidal abscess.  The surgical procedure has been discussed with the patient.  Potential risks, benefits, alternative treatments, and expected outcomes have been explained.  All of the patient's questions at this time have been answered.  The likelihood of reaching the patient's treatment goal is good.  The patient understand the proposed surgical procedure and wishes to proceed.        Shawntay Prest K. 04/07/2011, 4:03 PM

## 2011-04-08 NOTE — Anesthesia Postprocedure Evaluation (Signed)
  Anesthesia Post-op Note  Patient: Devin Burton  Procedure(s) Performed:  CYST EXCISION PILONIDAL SIMPLE - Excision of chronic pilonidal abscess  Patient Location: PACU  Anesthesia Type: General  Level of Consciousness: awake and alert   Airway and Oxygen Therapy: Patient Spontanous Breathing and Patient connected to nasal cannula oxygen  Post-op Pain: mild  Post-op Assessment: Post-op Vital signs reviewed, Patient's Cardiovascular Status Stable, Respiratory Function Stable and Patent Airway  Post-op Vital Signs: Reviewed and stable  Complications: No apparent anesthesia complications

## 2011-04-08 NOTE — Transfer of Care (Signed)
Immediate Anesthesia Transfer of Care Note  Patient: Devin Burton  Procedure(s) Performed:  CYST EXCISION PILONIDAL SIMPLE - Excision of chronic pilonidal abscess  Patient Location: PACU  Anesthesia Type: General  Level of Consciousness: awake, alert  and oriented  Airway & Oxygen Therapy: Patient Spontanous Breathing and Patient connected to nasal cannula oxygen  Post-op Assessment: Report given to PACU RN, Post -op Vital signs reviewed and stable and Patient moving all extremities X 4  Post vital signs: Reviewed and stable  Complications: No apparent anesthesia complications

## 2011-04-08 NOTE — Interval H&P Note (Signed)
History and Physical Interval Note:  04/08/2011 7:33 AM  Devin Burton  has presented today for surgery, with the diagnosis of Chronic pilonidal abscess  The various methods of treatment have been discussed with the patient and family. After consideration of risks, benefits and other options for treatment, the patient has consented to  Procedure(s): CYST EXCISION PILONIDAL SIMPLE as a surgical intervention .  The patients' history has been reviewed, patient examined, no change in status, stable for surgery.  I have reviewed the patients' chart and labs.  Questions were answered to the patient's satisfaction.     Kehinde Bowdish K.  04/08/2011,7:34 AM

## 2011-04-08 NOTE — Op Note (Signed)
Pre-op Dx - Chronic pilonidal abscess Post-op Dx - same Excision of chronic pilonidal abscess Surgeon - Doretha Goding K. Anesthesia - GETT Indications:  25 year old male s/p previous excision of pilonidal abscess now with recurrent chronic abscess.  He has failed several local procedures in the office and now presents for excision of the entire abscess.  Description of procedure - The patient was brought to the operating room on his stretcher.  After an adequate level of anesthesia was obtained, he was positioned in a prone position on the operating room table.  His buttocks were prepped with Betadine and draped in sterile fashion. A timeout was taken to ensure the proper patient and proper procedure. We infiltrated the area around the pilonidal abscess with quarter percent Marcaine with epinephrine. We made an elliptical incision around the old scar. Cautery was then used to dissect completely around all the abscess cavity and granulation tissue. We excised back to healthy appearing adipose tissue. The abscess only goes about a centimeter and a half deep. We excised some granulation tissue near the midline. We then thoroughly irrigated the entire cavity. We inspected carefully for hemostasis. The wound was closed with several deep sutures of 3-0 Vicryl. The skin was reapproximated with 2-0 Ethilon sutures. Xeroform gauze and dry dressings were used to cover the wound. The patient was then positioned back in a supine position on the stretcher and was extubated. All sponge, initially, and needle counts correct.     Wilmon Arms. Corliss Skains, MD, [facility] Surgery  04/08/2011 11:07 AM

## 2011-04-09 ENCOUNTER — Encounter (HOSPITAL_COMMUNITY): Payer: Self-pay | Admitting: Surgery

## 2011-04-14 ENCOUNTER — Encounter (INDEPENDENT_AMBULATORY_CARE_PROVIDER_SITE_OTHER): Payer: Self-pay | Admitting: Surgery

## 2011-04-14 ENCOUNTER — Telehealth (INDEPENDENT_AMBULATORY_CARE_PROVIDER_SITE_OTHER): Payer: Self-pay | Admitting: General Surgery

## 2011-04-14 ENCOUNTER — Ambulatory Visit (INDEPENDENT_AMBULATORY_CARE_PROVIDER_SITE_OTHER): Payer: 59 | Admitting: Surgery

## 2011-04-14 VITALS — BP 148/94 | HR 72 | Temp 99.1°F | Resp 12 | Ht 71.0 in | Wt 220.0 lb

## 2011-04-14 DIAGNOSIS — L0231 Cutaneous abscess of buttock: Secondary | ICD-10-CM

## 2011-04-14 DIAGNOSIS — L03317 Cellulitis of buttock: Secondary | ICD-10-CM

## 2011-04-14 MED ORDER — OXYCODONE-ACETAMINOPHEN 5-325 MG PO TABS: 1.0000 | ORAL_TABLET | ORAL | Status: AC | PRN

## 2011-04-14 NOTE — Progress Notes (Signed)
S/p wide reexcision of chronic pilonidal abscess last week.  Now with some erythema and drainage around his sutures.  Filed Vitals:   04/14/11 1038  BP: 148/94  Pulse: 72  Temp: 99.1 F (37.3 C)  Resp: 12    His incision is intact with some minimal serous drainage.  I removed two of the middle sutures and expressed a minimal amount of seroma.  He will pack this daily with 1/2 inch Nu-gauze and allow this to drain.  He has an appointment to see me on 04/24/11.  I refilled his Percocet.  Wilmon Arms. Corliss Skains, MD, [facility] Surgery  04/14/2011 11:33 AM

## 2011-04-14 NOTE — Patient Instructions (Signed)
Change the packing daily until healed.  Keep the wound covered.  You may shower.  Keep your follow-up appointment.

## 2011-04-14 NOTE — Telephone Encounter (Signed)
Pt called in stating he has clear fluid, blood coming from sx site. Pt had pilondial cyst excision (simple) sx 04/08/11. Consulted with Germaine who advised to page dr. Corliss Skains since he is in the office today and ask if he can be added to schedule.

## 2011-04-24 ENCOUNTER — Encounter (INDEPENDENT_AMBULATORY_CARE_PROVIDER_SITE_OTHER): Payer: Self-pay | Admitting: Surgery

## 2011-04-24 ENCOUNTER — Ambulatory Visit (INDEPENDENT_AMBULATORY_CARE_PROVIDER_SITE_OTHER): Payer: 59 | Admitting: Surgery

## 2011-04-24 VITALS — BP 120/80 | HR 68 | Temp 98.8°F | Resp 14 | Ht 71.0 in | Wt 222.4 lb

## 2011-04-24 DIAGNOSIS — L0231 Cutaneous abscess of buttock: Secondary | ICD-10-CM

## 2011-04-24 DIAGNOSIS — L03317 Cellulitis of buttock: Secondary | ICD-10-CM

## 2011-04-24 NOTE — Progress Notes (Signed)
We removed the remaining three sutures today.  The wound is open 2 x 1 cm and seems to be well-granulated.  Minimal bloody drainage.  No surrounding erythema.  Continue packing daily with 1/2 inch nu-gauze.  Recheck in 2-3 weeks.  Wilmon Arms. Corliss Skains, MD, [facility] Surgery  04/24/2011 12:52 PM

## 2011-04-24 NOTE — Patient Instructions (Signed)
Continue dressing changes.  ?

## 2011-05-08 ENCOUNTER — Ambulatory Visit (INDEPENDENT_AMBULATORY_CARE_PROVIDER_SITE_OTHER): Payer: 59 | Admitting: Surgery

## 2011-05-08 ENCOUNTER — Encounter (INDEPENDENT_AMBULATORY_CARE_PROVIDER_SITE_OTHER): Payer: Self-pay | Admitting: Surgery

## 2011-05-08 VITALS — BP 128/82 | HR 68 | Temp 97.8°F | Resp 16 | Ht 71.0 in | Wt 220.0 lb

## 2011-05-08 DIAGNOSIS — L0231 Cutaneous abscess of buttock: Secondary | ICD-10-CM

## 2011-05-08 NOTE — Progress Notes (Signed)
Here for a wound check - the wound seems to be completely filled with granulation tissue.  He actually has some heaped-up edges to the granulation tissue, which we cauterized with silver nitrate.  Overall, the wound seems to be healing quite well.  One more wound check in 2 weeks.  Devin Burton. Corliss Skains, MD, [facility] Surgery  05/08/2011 9:22 AM

## 2011-05-20 ENCOUNTER — Encounter (INDEPENDENT_AMBULATORY_CARE_PROVIDER_SITE_OTHER): Payer: Self-pay | Admitting: Surgery

## 2011-05-20 ENCOUNTER — Ambulatory Visit (INDEPENDENT_AMBULATORY_CARE_PROVIDER_SITE_OTHER): Payer: 59 | Admitting: Surgery

## 2011-05-20 VITALS — BP 150/98 | HR 72 | Temp 97.8°F | Resp 16 | Ht 71.0 in | Wt 222.2 lb

## 2011-05-20 DIAGNOSIS — L0231 Cutaneous abscess of buttock: Secondary | ICD-10-CM

## 2011-05-20 NOTE — Progress Notes (Signed)
He is here for another wound check.  The wound is much smaller - only about 5 mm across.  We cauterized again with silver nitrate.  The surrounding area is clean with no cellulitis or erythema.  Follow-up PRN.  Wilmon Arms. Corliss Skains, MD, [facility] Surgery  05/20/2011 9:36 AM

## 2011-06-19 ENCOUNTER — Encounter: Payer: Self-pay | Admitting: Family Medicine

## 2011-06-19 ENCOUNTER — Telehealth: Payer: Self-pay | Admitting: Internal Medicine

## 2011-06-19 ENCOUNTER — Ambulatory Visit (INDEPENDENT_AMBULATORY_CARE_PROVIDER_SITE_OTHER): Payer: 59 | Admitting: Family Medicine

## 2011-06-19 VITALS — BP 130/82 | Temp 98.4°F | Wt 223.0 lb

## 2011-06-19 DIAGNOSIS — T300 Burn of unspecified body region, unspecified degree: Secondary | ICD-10-CM

## 2011-06-19 DIAGNOSIS — T304 Corrosion of unspecified body region, unspecified degree: Secondary | ICD-10-CM

## 2011-06-19 DIAGNOSIS — IMO0002 Reserved for concepts with insufficient information to code with codable children: Secondary | ICD-10-CM

## 2011-06-19 MED ORDER — HYDROCODONE-ACETAMINOPHEN 5-500 MG PO TABS: ORAL_TABLET | ORAL | Status: DC

## 2011-06-19 NOTE — Telephone Encounter (Signed)
Pt has sch ov to see Dr Caryl Never today re: a rash pt has on chest and lower abd. Pt wanted to see if Dr Lovell Sheehan could call a med in for rash? [facility] [place] [facility] of [address].

## 2011-06-19 NOTE — Patient Instructions (Signed)
Keep area clean with soap and water daily Apply topical antibiotic twice daily for the next 3-4 days Follow up promptly for any signs of infection such as fever or increased redness.

## 2011-06-19 NOTE — Telephone Encounter (Signed)
Let pt see dr Caryl Never for this

## 2011-06-19 NOTE — Progress Notes (Signed)
  Subjective:    Patient ID: Devin Burton, male    DOB: 05/20/1985, 26 y.o.   MRN: [id number]  HPI  Patient seen acute visit with perianal rash. 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  He noticed pants were wet and he subsequently developed some burning sensation and some itching. This worsened through the night. He has some pain with ambulation today. This morning after a bowel movement noticed some bright blood with wiping. He tried some baby powder and moisturizing lotion which did not help.   Review of Systems  Constitutional: Negative for fever and chills.  Gastrointestinal: Negative for diarrhea and constipation.  Skin: Positive for rash.       Objective:   Physical Exam  Constitutional: He appears well-developed and well-nourished.  Cardiovascular: Normal rate and regular rhythm.   Pulmonary/Chest: Effort normal and breath sounds normal. No respiratory distress. He has no wheezes. He has no rales.  Skin:       Patient has caustic type burn right and left buttocks perianal region each side about 3 x 3 cm area. Slightly denuded epithelium. No cellulitis changes.          Assessment & Plan:  Chemical burn/irritation. No signs of secondary infection this time. Supplied Silvadene cream to use twice daily. Clean daily with soap and water. Followup promptly for signs and secondary infection. Limited Vicodin #20 one to 2 every 6 hours for severe pain

## 2011-07-21 ENCOUNTER — Other Ambulatory Visit: Payer: Self-pay | Admitting: *Deleted

## 2011-07-21 MED ORDER — SERTRALINE HCL 25 MG PO TABS: 50.0000 mg | ORAL_TABLET | Freq: Every day | ORAL | Status: DC

## 2011-07-22 ENCOUNTER — Other Ambulatory Visit: Payer: Self-pay | Admitting: *Deleted

## 2011-07-22 MED ORDER — SERTRALINE HCL 25 MG PO TABS: 50.0000 mg | ORAL_TABLET | Freq: Every day | ORAL | Status: DC

## 2011-08-13 ENCOUNTER — Other Ambulatory Visit: Payer: Self-pay | Admitting: Internal Medicine

## 2011-08-13 NOTE — Telephone Encounter (Signed)
Dr. Jenkins pt. 

## 2011-11-25 ENCOUNTER — Ambulatory Visit: Payer: 59 | Admitting: Internal Medicine

## 2011-12-26 ENCOUNTER — Ambulatory Visit (INDEPENDENT_AMBULATORY_CARE_PROVIDER_SITE_OTHER): Payer: BC Managed Care – PPO | Admitting: Family Medicine

## 2011-12-26 DIAGNOSIS — L259 Unspecified contact dermatitis, unspecified cause: Secondary | ICD-10-CM

## 2011-12-26 DIAGNOSIS — IMO0002 Reserved for concepts with insufficient information to code with codable children: Secondary | ICD-10-CM

## 2011-12-26 DIAGNOSIS — T22039A Burn of unspecified degree of unspecified upper arm, initial encounter: Secondary | ICD-10-CM

## 2011-12-26 DIAGNOSIS — T22439A Corrosion of unspecified degree of unspecified upper arm, initial encounter: Secondary | ICD-10-CM

## 2011-12-26 MED ORDER — METHYLPREDNISOLONE ACETATE 80 MG/ML IJ SUSP: 120.0000 mg | Freq: Once | INTRAMUSCULAR | Status: DC

## 2011-12-26 NOTE — Patient Instructions (Addendum)

## 2011-12-26 NOTE — Progress Notes (Signed)
  Subjective:    Patient ID: Devin Burton, male    DOB: 01-Nov-1985, 26 y.o.   MRN: [id number]  HPI  Acute visit. Pruritic rash upper extremities and trunk. He does landscaping work. Symptoms off and on for 3 weeks. Went to company doctor and given prednisone but only five-day course. Initially held but then had breakthrough. Patient applied Clorox to left arm 2 days ago and had chemical burn. No fevers or chills. No drainage. Rash exacerbated by heat   Review of Systems  Constitutional: Negative for fever and chills.  Skin: Positive for rash.       Objective:   Physical Exam  Constitutional: He appears well-developed and well-nourished.  Cardiovascular: Normal rate and regular rhythm.   Skin:       Patient minimally raised slightly excoriated rash right inner arm and a couple areas on the trunk. Left arm antecubital fossa reveals area of chemical burn about 4 x 4 centimeters. No signs of secondary infection. No drainage.          Assessment & Plan:  #1 contact dermatitis. Widespread involvement. Reviewed steroid options and patient requests injectable. Depo-Medrol 120 mg IM given. Call back for any breakthrough symptoms #2 chemical burn left arm. No signs of secondary infection.

## 2011-12-29 ENCOUNTER — Telehealth: Payer: Self-pay | Admitting: Internal Medicine

## 2011-12-29 ENCOUNTER — Ambulatory Visit: Payer: BC Managed Care – PPO | Admitting: Family

## 2011-12-29 ENCOUNTER — Telehealth: Payer: Self-pay | Admitting: Family Medicine

## 2011-12-29 MED ORDER — PREDNISONE 10 MG PO TABS: 10.0000 mg | ORAL_TABLET | Freq: Every day | ORAL | Status: DC

## 2011-12-29 NOTE — Telephone Encounter (Signed)
Pt informed on personally identified VM 

## 2011-12-29 NOTE — Telephone Encounter (Signed)
I called the patient back, because he called [facility] again and they made him an appt & did not tell him a rx had been called it. Pt wants to try the Prednisone first. Will call if now working. Appt w/[facility] canceled.

## 2011-12-29 NOTE — Telephone Encounter (Signed)
Devin Burton,  I've already spoken to him about the Prednisone that was called in. He probably heard your vmail after that, so he may think something has changed. Please call w/Prednisone info. [facility].

## 2011-12-29 NOTE — Telephone Encounter (Signed)
Caller: Ambrosio Paul/Patient; Patient Name: Devin Burton; PCP: Evelena Peat [facility]); Best Callback Phone Number: [phone]; Reason for call: Rash/Hives Pt had poison ivy on Friday. He came in for an injection of steroids. Pt states it is spreading on his right arm/ all down to his axilla is covered. pt was instructed to call back if it worsened. [facility].

## 2011-12-29 NOTE — Telephone Encounter (Signed)
Oral prednisone 10 mg taper: 6-6-4-4-4-3-3-2-2-1-1 disp 36

## 2011-12-29 NOTE — Telephone Encounter (Signed)
Devin Burton,  Based on the note below, I cannot tell if they are wanting [place] to call in a rx, etc. Please advise. Thank you.

## 2011-12-29 NOTE — Telephone Encounter (Signed)
Caller: Devin Burton/Patient. PCP: Darryll Capers. Call back number [phone]. Call regarding: rash to upper right arm, axillary region and right side. Was seen on 12/26/11 and given a Cortisone shot with no improvement. Reports burning/tingling to the rash. Reports rash is warm to the touch and looks swollen at this time. Emergent symptom of "Signs of secondary infection" positive per Poison [place], [place], or [place] Exposure guideline. Disposition: See provider within 4 hours. Appointment scheduled 12/29/11 at 3:45pm with [name], FNP. Instructed patient to call back if symptoms worsen or new symptoms develop.

## 2011-12-29 NOTE — Telephone Encounter (Signed)
Caller: Dakai/Patient; Phone: [phone]; Reason for Call: Retruning call to the office about medication

## 2011-12-29 NOTE — Addendum Note (Signed)
Addended by: Melchor Amour on: 12/29/2011 01:22 PM   Modules accepted: Orders

## 2012-04-30 ENCOUNTER — Encounter (INDEPENDENT_AMBULATORY_CARE_PROVIDER_SITE_OTHER): Payer: Self-pay | Admitting: Surgery

## 2012-04-30 ENCOUNTER — Ambulatory Visit (INDEPENDENT_AMBULATORY_CARE_PROVIDER_SITE_OTHER): Payer: BC Managed Care – PPO | Admitting: Surgery

## 2012-04-30 VITALS — BP 119/80 | HR 80 | Temp 98.6°F | Resp 16 | Ht 71.0 in | Wt 226.2 lb

## 2012-04-30 DIAGNOSIS — L03317 Cellulitis of buttock: Secondary | ICD-10-CM

## 2012-04-30 DIAGNOSIS — L0231 Cutaneous abscess of buttock: Secondary | ICD-10-CM

## 2012-04-30 MED ORDER — DOXYCYCLINE HYCLATE 100 MG PO CAPS: 100.0000 mg | ORAL_CAPSULE | Freq: Two times a day (BID) | ORAL | Status: DC

## 2012-04-30 NOTE — Progress Notes (Signed)
This patient is about 15 months status post pilonidal cystectomy. He was last seen about a year ago. Over the last couple of months he has developed intermittent pain and swelling in his incision. This has popped open about 4 times and drained small amounts. Currently there is no drainage. He comes in today for evaluation.  Filed Vitals:   04/30/12 1129  BP: 119/80  Pulse: 80  Temp: 98.6 F (37 C)  Resp: 16   On examination the patient has a vertical incision just to the left of his coccyx. This is all well-healed. In the middle of the incision is a 1 cm area of crusting. There is no surrounding erythema. No induration. This area seems to be very superficial with no deep induration or tenderness.  Impression: Superficial infection in his old pilonidal scar.There is no area of fluctuance that needs to be drained at this time. Recommend using warm compresses. Will also try a course of doxycycline. Recheck in 2 weeks.  Wilmon Arms. Corliss Skains, MD, [facility] Surgery  04/30/2012 12:02 PM

## 2012-05-03 ENCOUNTER — Ambulatory Visit (INDEPENDENT_AMBULATORY_CARE_PROVIDER_SITE_OTHER): Payer: 59 | Admitting: Surgery

## 2012-05-11 ENCOUNTER — Encounter (INDEPENDENT_AMBULATORY_CARE_PROVIDER_SITE_OTHER): Payer: Self-pay | Admitting: Surgery

## 2012-05-11 ENCOUNTER — Encounter (INDEPENDENT_AMBULATORY_CARE_PROVIDER_SITE_OTHER): Payer: BC Managed Care – PPO | Admitting: Surgery

## 2012-05-11 ENCOUNTER — Ambulatory Visit (INDEPENDENT_AMBULATORY_CARE_PROVIDER_SITE_OTHER): Payer: BC Managed Care – PPO | Admitting: Surgery

## 2012-05-11 VITALS — BP 120/68 | HR 76 | Temp 98.2°F | Resp 16 | Ht 71.0 in | Wt 225.0 lb

## 2012-05-11 DIAGNOSIS — L0231 Cutaneous abscess of buttock: Secondary | ICD-10-CM

## 2012-05-11 NOTE — Progress Notes (Signed)
The patient comes in for examination of his pilonidal cyst. He was unable to tolerate the doxycycline. This caused nausea and vomiting. After the last visit the patient states that he had a lot of drainage which has now dried up. Currently he is asymptomatic.  The lower part of the incision is well healed with no sign of infection and no erythema. The patient has a 1 cm area in the most posterior aspect of the previous surgical incision that is slightly fluctuant. This does not seem to track very deeply. We prepped this area with Betadine and anesthetized with 1% lidocaine. I made a 1 cm round incision. There is some chronic granulation tissue deep to this area but this does not seem to track very deeply. I thoroughly cauterized the granulation tissue with silver nitrate. He will treat this area with Neosporin and a Band-Aid until completely healed. Recheck 2-3 weeks  Devin Burton. Devin Skains, MD, [facility] Surgery  05/11/2012 6:19 PM

## 2012-05-14 ENCOUNTER — Encounter (INDEPENDENT_AMBULATORY_CARE_PROVIDER_SITE_OTHER): Payer: BC Managed Care – PPO | Admitting: Surgery

## 2012-05-26 ENCOUNTER — Ambulatory Visit (INDEPENDENT_AMBULATORY_CARE_PROVIDER_SITE_OTHER): Payer: BC Managed Care – PPO | Admitting: Surgery

## 2012-05-26 ENCOUNTER — Encounter (INDEPENDENT_AMBULATORY_CARE_PROVIDER_SITE_OTHER): Payer: Self-pay | Admitting: Surgery

## 2012-05-26 VITALS — BP 123/83 | HR 70 | Temp 98.4°F | Resp 14 | Ht 71.0 in | Wt 225.0 lb

## 2012-05-26 DIAGNOSIS — L0231 Cutaneous abscess of buttock: Secondary | ICD-10-CM

## 2012-05-26 NOTE — Progress Notes (Signed)
His pilonidal I&D site seems to be completely healed. There is no surrounding inflammation. No induration. No erythema noted. The scab has come off. I do not see any drainage. He may resume full activity.  Follow-up as needed.  Wilmon Arms. Corliss Skains, MD, [facility] Surgery  05/26/2012 4:11 PM

## 2012-06-21 ENCOUNTER — Telehealth: Payer: Self-pay | Admitting: Internal Medicine

## 2012-06-21 NOTE — Telephone Encounter (Signed)
Out front waiting for pt

## 2012-06-21 NOTE — Telephone Encounter (Signed)
Devin Burton wants to know if you have another Nexium discount card for his grandson (pt). Pls advise.

## 2012-08-16 ENCOUNTER — Ambulatory Visit (INDEPENDENT_AMBULATORY_CARE_PROVIDER_SITE_OTHER): Payer: BC Managed Care – PPO | Admitting: Family Medicine

## 2012-08-16 DIAGNOSIS — J029 Acute pharyngitis, unspecified: Secondary | ICD-10-CM

## 2012-08-16 DIAGNOSIS — B349 Viral infection, unspecified: Secondary | ICD-10-CM

## 2012-08-16 DIAGNOSIS — B9789 Other viral agents as the cause of diseases classified elsewhere: Secondary | ICD-10-CM

## 2012-08-16 LAB — POCT RAPID STREP A (OFFICE): Rapid Strep A Screen: NEGATIVE

## 2012-08-16 MED ORDER — OMEPRAZOLE 40 MG PO CPDR: 40.0000 mg | DELAYED_RELEASE_CAPSULE | Freq: Every day | ORAL | Status: DC

## 2012-08-16 MED ORDER — HYDROCODONE-HOMATROPINE 5-1.5 MG/5ML PO SYRP: 5.0000 mL | ORAL_SOLUTION | Freq: Four times a day (QID) | ORAL | Status: AC | PRN

## 2012-08-16 NOTE — Patient Instructions (Addendum)

## 2012-08-16 NOTE — Progress Notes (Signed)
  Subjective:    Patient ID: Devin Burton, male    DOB: [birth year], 27 y.o.   MRN: [id number]  HPI Patient seen for acute visit Onset 3 days ago sore throat, body aches, malaise, mostly nonproductive cough. He has some nasal congestion which he attributes probably to allergies. No sick contacts. Denies any nausea or vomiting. No significant headaches. Patient is nonsmoker. Getting married this Saturday [facility]  Past Medical History  Diagnosis Date  . ALLERGIC RHINITIS 05/28/2007  . GERD (gastroesophageal reflux disease)   . Hemorrhoid   . Abscess     left buttock  . Pilonidal cyst   . Complication of anesthesia     hard to wake up, nausea  . Headache   . Pneumonia 3-4 years ago   Past Surgical History  Procedure Laterality Date  . Fracture surgery  2009    right wrist  . Tonsillectomy    . Pilonidal cyst excision  12/30/10  . Pilonidal cyst excision  04/08/2011    Procedure: CYST EXCISION PILONIDAL SIMPLE;  Surgeon: Wilmon Arms. Corliss Skains, MD;  Location: [facility] OR;  Service: General;  Laterality: N/A;  Excision of chronic pilonidal abscess    reports that he has never smoked. He has never used smokeless tobacco. He reports that  drinks alcohol. He reports that he does not use illicit drugs. family history includes HIV in his father and mother.  There is no history of Colon cancer. Allergies  Allergen Reactions  . Penicillins Anaphylaxis    Of throat  . Clarithromycin     REACTION: SOB, Palpitations  . Sulfamethoxazole W-Trimethoprim     Patient unsure of reaction.      Review of Systems  Constitutional: Positive for chills and fatigue. Negative for appetite change.  HENT: Positive for congestion and sore throat.   Respiratory: Positive for cough.        Objective:   Physical Exam  Constitutional: He appears well-developed and well-nourished.  HENT:  Right Ear: External ear normal.  Left Ear: External ear normal.  Mouth/Throat: Oropharynx is clear  and moist.  Neck: Neck supple.  Cardiovascular: Normal rate and regular rhythm.   Pulmonary/Chest: Effort normal and breath sounds normal. No respiratory distress. He has no wheezes. He has no rales.  Lymphadenopathy:    He has no cervical adenopathy.          Assessment & Plan:  Viral syndrome. Rapid strep negative. Symptomatic treatment. Hycodan cough syrup for nighttime coughing if not relieved with over-the-counter medications.

## 2012-08-23 ENCOUNTER — Telehealth: Payer: Self-pay | Admitting: Internal Medicine

## 2012-08-23 NOTE — Telephone Encounter (Signed)
Patient Information:  Caller Name: Alphonzo  Phone: [phone]  Patient: Devin Burton, Devin Burton  Gender: Male  DOB: 1986/04/21  Age: 27 Years  PCP: Darryll Capers (Adults only)  Office Follow Up:  Does the office need to follow up with this patient?: No  Instructions For The Office: N/A  RN Note:  Home care advice provided along with call back parameters  Symptoms  Reason For Call & Symptoms: Patient was seen in the office on 08/16/12 by Dr. Caryl Never and diagnosed with Viral syndrom and sore throat.  He completed all medication, Hycodan , nasonex and zyrtec.  He still feels week , +cough productive green yellow. Allergy  symptoms nasal pressure. Afebrile  Reviewed Health History In EMR: Yes  Reviewed Medications In EMR: Yes  Reviewed Allergies In EMR: Yes  Reviewed Surgeries / Procedures: Yes  Date of Onset of Symptoms: 08/16/2012  Treatments Tried: Hycodan wtih 2 doses left , zyrtec, nasonex  Treatments Tried Worked: No  Guideline(s) Used:  Cough  Disposition Per Guideline:   See Within 3 Days in Office  Reason For Disposition Reached:   Cough has been present for > 10 days  Advice Given:  Reassurance  Coughing is the way that our lungs remove irritants and mucus. It helps protect our lungs from getting pneumonia.  You can get a dry hacking cough after a chest cold. Sometimes this type of cough can last 1-3 weeks, and be worse at night.  You can also get a cough after being exposed to irritating substances like smoke, strong perfumes, and dust.  Cough Medicines:  OTC Cough Drops: Cough drops can help a lot, especially for mild coughs. They reduce coughing by soothing your irritated throat and removing that tickle sensation in the back of the throat. Cough drops also have the advantage of portability - you can carry them with you.  Home Remedy - Honey: This old home remedy has been shown to help decrease coughing at night. The adult dosage is 2 teaspoons (10 ml) at bedtime. Honey should  not be given to infants under one year of age.  Prevent Dehydration:  Drink adequate liquids.  This will help soothe an irritated or dry throat and loosen up the phlegm.  Avoid Tobacco Smoke:  Smoking or being exposed to smoke makes coughs much worse.  Call Back If:  Difficulty breathing  Fever lasts > 3 days  You become worse.  For a Stuffy Nose - Use Nasal Washes:  Introduction: Saline (salt water) nasal irrigation (nasal wash) is an effective and simple home remedy for treating stuffy nose and sinus congestion. The nose can be irrigated by pouring, spraying, or squirting salt water into the nose and then letting it run back out.  How it Helps: The salt water rinses out excess mucus, washes out any irritants (dust, allergens) that might be present, and moistens the nasal cavity.  Methods: There are several ways to perform nasal irrigation. You can use a saline nasal spray bottle (available over-the-counter), a rubber ear syringe, a medical syringe without the needle, or a Neti Pot.  Patient Will Follow Care Advice:  YES  Appointment Scheduled:  08/24/2012 15:45:00 Appointment Scheduled Provider:  [name] [facility])

## 2012-08-24 ENCOUNTER — Ambulatory Visit (INDEPENDENT_AMBULATORY_CARE_PROVIDER_SITE_OTHER): Payer: BC Managed Care – PPO | Admitting: Family

## 2012-08-24 ENCOUNTER — Encounter: Payer: Self-pay | Admitting: Family

## 2012-08-24 VITALS — BP 120/80 | HR 73 | Temp 98.6°F | Wt 220.0 lb

## 2012-08-24 DIAGNOSIS — J309 Allergic rhinitis, unspecified: Secondary | ICD-10-CM

## 2012-08-24 DIAGNOSIS — J322 Chronic ethmoidal sinusitis: Secondary | ICD-10-CM

## 2012-08-24 MED ORDER — GUAIFENESIN-CODEINE 100-10 MG/5ML PO SYRP: 5.0000 mL | ORAL_SOLUTION | Freq: Three times a day (TID) | ORAL | Status: DC | PRN

## 2012-08-24 MED ORDER — FEXOFENADINE-PSEUDOEPHED ER 180-240 MG PO TB24: 1.0000 | ORAL_TABLET | Freq: Every day | ORAL | Status: AC

## 2012-08-24 MED ORDER — DOXYCYCLINE HYCLATE 100 MG PO TABS: 100.0000 mg | ORAL_TABLET | Freq: Two times a day (BID) | ORAL | Status: DC

## 2012-08-24 NOTE — Patient Instructions (Addendum)

## 2012-08-25 ENCOUNTER — Encounter: Payer: Self-pay | Admitting: Family

## 2012-08-25 NOTE — Progress Notes (Signed)
Subjective:    Patient ID: Devin Burton, male    DOB: [birth year], 27 y.o.   MRN: [id number]  HPI 27 year old white male, nonsmoker is in today with persistent complaints of cough, congestion and sinus pressure. He is recently developed fever and fatigue. He was seen by Dr. Caryl Never 10 days ago with similar symptoms that initially fell anyway improving or worse today. He's been taken over-the-counter Zyrtec been using Flonase with no relief. He is a Administrator and works outside daily.   Review of Systems  Constitutional: Positive for fever and fatigue.  HENT: Positive for congestion, sore throat, rhinorrhea, postnasal drip and sinus pressure. Negative for facial swelling and ear discharge.   Eyes: Negative.   Respiratory: Positive for cough. Negative for shortness of breath and wheezing.   Cardiovascular: Negative.   Skin: Negative.   Allergic/Immunologic: Positive for environmental allergies. Negative for food allergies and immunocompromised state.  Psychiatric/Behavioral: Negative.    Past Medical History  Diagnosis Date  . ALLERGIC RHINITIS 05/28/2007  . GERD (gastroesophageal reflux disease)   . Hemorrhoid   . Abscess     left buttock  . Pilonidal cyst   . Complication of anesthesia     hard to wake up, nausea  . Headache   . Pneumonia 3-4 years ago    History   Social History  . Marital Status: Single    Spouse Name: N/A    Number of Children: N/A  . Years of Education: N/A   Occupational History  . [employer]     Social History Main Topics  . Smoking status: Never Smoker   . Smokeless tobacco: Never Used  . Alcohol Use: Yes     Comment: 3 daily   . Drug Use: No  . Sexually Active: Yes    Birth Control/ Protection: Other-see comments     Comment: girlfriend uses birth control   Other Topics Concern  . Not on file   Social History Narrative   4 caffeine drinks daily     Past Surgical History  Procedure Laterality Date  . Fracture surgery   2009    right wrist  . Tonsillectomy    . Pilonidal cyst excision  12/30/10  . Pilonidal cyst excision  04/08/2011    Procedure: CYST EXCISION PILONIDAL SIMPLE;  Surgeon: Wilmon Arms. Corliss Skains, MD;  Location: [facility] OR;  Service: General;  Laterality: N/A;  Excision of chronic pilonidal abscess    Family History  Problem Relation Age of Onset  . HIV Mother   . HIV Father   . Colon cancer Neg Hx     Allergies  Allergen Reactions  . Penicillins Anaphylaxis    Of throat  . Clarithromycin     REACTION: SOB, Palpitations  . Sulfamethoxazole W-Trimethoprim     Patient unsure of reaction.    Current Outpatient Prescriptions on File Prior to Visit  Medication Sig Dispense Refill  . omeprazole (PRILOSEC) 40 MG capsule Take 1 capsule (40 mg total) by mouth daily.  30 capsule  11  . HYDROcodone-homatropine (HYCODAN) 5-1.5 MG/5ML syrup Take 5 mLs by mouth every 6 (six) hours as needed for cough.  120 mL  0   No current facility-administered medications on file prior to visit.    BP 120/80  Pulse 73  Temp(Src) 98.6 F (37 C) (Oral)  Wt 220 lb (99.791 kg)  BMI 30.7 kg/m2  SpO2 99%chart    Objective:   Physical Exam  Constitutional: He is oriented to person, place,  and time. He appears well-developed and well-nourished.  HENT:  Right Ear: External ear normal.  Left Ear: External ear normal.  Nose: Nose normal.  Mouth/Throat: Oropharynx is clear and moist.  Moist sinus tenderness to palpation bilaterally.  Neck: Normal range of motion. Neck supple.  Cardiovascular: Normal rate, regular rhythm and normal heart sounds.   Pulmonary/Chest: Effort normal and breath sounds normal.  Neurological: He is alert and oriented to person, place, and time.  Skin: Skin is warm and dry.  Psychiatric: He has a normal mood and affect.          Assessment & Plan:  Assessment:  1. Sinusitis 2. Allergic rhinitis  Plan: Allegra-D 24 hours once daily. Doxycycline 100 mg twice a day x7 days. Rest.  Drink plenty of fluids. Renew prescription for Hycodan cough syrup that he can rest at night. Call the office if symptoms worsen or persist. Recheck as scheduled, and as needed.

## 2012-09-10 ENCOUNTER — Other Ambulatory Visit: Payer: Self-pay | Admitting: *Deleted

## 2012-09-10 MED ORDER — OMEPRAZOLE 40 MG PO CPDR: 40.0000 mg | DELAYED_RELEASE_CAPSULE | Freq: Every day | ORAL | Status: DC

## 2012-09-10 NOTE — Telephone Encounter (Signed)
Patient request 57  Prescription of omeprazole

## 2013-10-18 ENCOUNTER — Ambulatory Visit (INDEPENDENT_AMBULATORY_CARE_PROVIDER_SITE_OTHER): Payer: BC Managed Care – PPO | Admitting: Family Medicine

## 2013-10-18 ENCOUNTER — Encounter: Payer: Self-pay | Admitting: Family Medicine

## 2013-10-18 VITALS — BP 124/74 | HR 60 | Temp 97.9°F | Wt 215.0 lb

## 2013-10-18 DIAGNOSIS — R197 Diarrhea, unspecified: Secondary | ICD-10-CM

## 2013-10-18 DIAGNOSIS — R634 Abnormal weight loss: Secondary | ICD-10-CM

## 2013-10-18 LAB — BASIC METABOLIC PANEL
BUN: 12 mg/dL (ref 6–23)
CO2: 27 meq/L (ref 19–32)
CREATININE: 1 mg/dL (ref 0.4–1.5)
Calcium: 9.3 mg/dL (ref 8.4–10.5)
Chloride: 101 mEq/L (ref 96–112)
GFR: 91.73 mL/min (ref >60.00)
Glucose, Bld: 84 mg/dL (ref 70–99)
Potassium: 4 mEq/L (ref 3.5–5.1)
SODIUM: 136 meq/L (ref 135–145)

## 2013-10-18 LAB — TSH: TSH: 1.12 u[IU]/mL (ref 0.35–4.50)

## 2013-10-18 LAB — SEDIMENTATION RATE: Sed Rate: 9 mm/hr (ref 0–22)

## 2013-10-18 NOTE — Patient Instructions (Signed)
Diarrhea  Diarrhea is frequent loose and watery bowel movements. It can cause you to feel weak and dehydrated. Dehydration can cause you to become tired and thirsty, have a dry mouth, and have decreased urination that often is dark yellow. Diarrhea is a sign of another problem, most often an infection that will not last long. In most cases, diarrhea typically lasts 2-3 days. However, it can last longer if it is a sign of something more serious. It is important to treat your diarrhea as directed by your caregive to lessen or prevent future episodes of diarrhea.  CAUSES   Some common causes include:   Gastrointestinal infections caused by viruses, bacteria, or parasites.   Food poisoning or food allergies.   Certain medicines, such as antibiotics, chemotherapy, and laxatives.   Artificial sweeteners and fructose.   Digestive disorders.  HOME CARE INSTRUCTIONS   Ensure adequate fluid intake (hydration): have 1 cup (8 oz) of fluid for each diarrhea episode. Avoid fluids that contain simple sugars or sports drinks, fruit juices, whole milk products, and sodas. Your urine should be clear or pale yellow if you are drinking enough fluids. Hydrate with an oral rehydration solution that you can purchase at pharmacies, retail stores, and online. You can prepare an oral rehydration solution at home by mixing the following ingredients together:    - tsp table salt.    tsp baking soda.    tsp salt substitute containing potassium chloride.   1  tablespoons sugar.   1 L (34 oz) of water.   Certain foods and beverages may increase the speed at which food moves through the gastrointestinal (GI) tract. These foods and beverages should be avoided and include:   Caffeinated and alcoholic beverages.   High-fiber foods, such as raw fruits and vegetables, nuts, seeds, and whole grain breads and cereals.   Foods and beverages sweetened with sugar alcohols, such as xylitol, sorbitol, and mannitol.   Some foods may be well  tolerated and may help thicken stool including:   Starchy foods, such as rice, toast, pasta, low-sugar cereal, oatmeal, grits, baked potatoes, crackers, and bagels.   Bananas.   Applesauce.   Add probiotic-rich foods to help increase healthy bacteria in the GI tract, such as yogurt and fermented milk products.   Wash your hands well after each diarrhea episode.   Only take over-the-counter or prescription medicines as directed by your caregiver.   Take a warm bath to relieve any burning or pain from frequent diarrhea episodes.  SEEK IMMEDIATE MEDICAL CARE IF:    You are unable to keep fluids down.   You have persistent vomiting.   You have blood in your stool, or your stools are black and tarry.   You do not urinate in 6-8 hours, or there is only a small amount of very dark urine.   You have abdominal pain that increases or localizes.   You have weakness, dizziness, confusion, or lightheadedness.   You have a severe headache.   Your diarrhea gets worse or does not get better.   You have a fever or persistent symptoms for more than 2-3 days.   You have a fever and your symptoms suddenly get worse.  MAKE SURE YOU:    Understand these instructions.   Will watch your condition.   Will get help right away if you are not doing well or get worse.  Document Released: 03/28/2002 Document Revised: 03/24/2012 Document Reviewed: 12/14/2011  ExitCare Patient Information 2015 [employer]. This information   is not intended to replace advice given to you by your health care provider. Make sure you discuss any questions you have with your health care provider.

## 2013-10-18 NOTE — Progress Notes (Signed)
   Subjective:    Patient ID: Devin Burton, male    DOB: Apr 02, 1986, 28 y.o.   MRN: [id number]  HPI Patient seen for hospital followup. He presented there on 10/13/2013 with diarrhea. He has had onset of diarrhea about 12 days ago. He describes about 6-7 nonbloody watery stools per day. He had some diffuse abdominal cramps off and on. He has developed increased pain perianal region with stools but no bloody stools. He had several labs including CBC and chemistries which were unremarkable. C. difficile PCR screen negative. Stool culture negative. No recent travels. No recent antibiotics. Slightly diminished appetite. Modest 3-5 pound weight loss.  Patient was prescribed Lomotil which did not help his diarrhea much. He has taken over-the-counter Imodium which does help somewhat. No family history of inflammatory bowel disease. Patient takes no regular prescription medications  Past Medical History  Diagnosis Date  . ALLERGIC RHINITIS 05/28/2007  . GERD (gastroesophageal reflux disease)   . Hemorrhoid   . Abscess     left buttock  . Pilonidal cyst   . Complication of anesthesia     hard to wake up, nausea  . Headache(784.0)   . Pneumonia 3-4 years ago   Past Surgical History  Procedure Laterality Date  . Fracture surgery  2009    right wrist  . Tonsillectomy    . Pilonidal cyst excision  12/30/10  . Pilonidal cyst excision  04/08/2011    Procedure: CYST EXCISION PILONIDAL SIMPLE;  Surgeon: Imogene Burn. Georgette Dover, MD;  Location: [place] OR;  Service: General;  Laterality: N/A;  Excision of chronic pilonidal abscess    reports that he has never smoked. He has never used smokeless tobacco. He reports that he drinks alcohol. He reports that he does not use illicit drugs. family history includes HIV in his father and mother. There is no history of Colon cancer. Allergies  Allergen Reactions  . Penicillins Anaphylaxis    Of throat  . Clarithromycin     REACTION: SOB, Palpitations  .  Sulfamethoxazole-Trimethoprim     Patient unsure of reaction.      Review of Systems  Constitutional: Positive for fatigue. Negative for fever, chills and appetite change.  Respiratory: Negative for cough and shortness of breath.   Cardiovascular: Negative for chest pain.  Gastrointestinal: Positive for abdominal pain and diarrhea. Negative for nausea, vomiting, constipation, blood in stool and abdominal distention.  Endocrine: Negative for polydipsia and polyuria.       Objective:   Physical Exam  Constitutional: He appears well-developed and well-nourished.  Cardiovascular: Normal rate and regular rhythm.   Pulmonary/Chest: Effort normal and breath sounds normal. No respiratory distress. He has no wheezes. He has no rales.  Abdominal: Soft. Bowel sounds are normal. He exhibits no distension and no mass. There is no tenderness. There is no rebound and no guarding.  Genitourinary:  No external hemorrhoids.  No obvious fissure.  No rash.          Assessment & Plan:  Persistent diarrhea with workup as above unrevealing.  Duration is somewhat concerning.  He appears adequately hydrated.  Check TSH, ESR, repeat BMP.  Set up GI referral.  Diet discussed.  Continue Imodium as needed.

## 2013-10-18 NOTE — Progress Notes (Signed)
Pre visit review using our clinic review tool, if applicable. No additional management support is needed unless otherwise documented below in the visit note. 

## 2013-11-21 ENCOUNTER — Encounter: Payer: Self-pay | Admitting: Family Medicine

## 2014-02-27 ENCOUNTER — Ambulatory Visit: Payer: BC Managed Care – PPO | Admitting: Family Medicine

## 2014-07-19 ENCOUNTER — Encounter: Payer: Self-pay | Admitting: Family Medicine

## 2014-07-19 ENCOUNTER — Ambulatory Visit (INDEPENDENT_AMBULATORY_CARE_PROVIDER_SITE_OTHER): Payer: BLUE CROSS/BLUE SHIELD | Admitting: Family Medicine

## 2014-07-19 ENCOUNTER — Other Ambulatory Visit: Payer: BLUE CROSS/BLUE SHIELD

## 2014-07-19 VITALS — BP 127/84 | HR 74 | Temp 97.9°F | Ht 72.0 in | Wt 219.0 lb

## 2014-07-19 DIAGNOSIS — R509 Fever, unspecified: Secondary | ICD-10-CM | POA: Diagnosis not present

## 2014-07-19 DIAGNOSIS — B882 Other arthropod infestations: Secondary | ICD-10-CM

## 2014-07-19 DIAGNOSIS — R072 Precordial pain: Secondary | ICD-10-CM

## 2014-07-19 LAB — CBC WITH DIFFERENTIAL/PLATELET
BASOS ABS: 0.1 10*3/uL (ref 0.0–0.1)
Basophils Relative: 0.6 % (ref 0.0–3.0)
EOS ABS: 0.2 10*3/uL (ref 0.0–0.7)
Eosinophils Relative: 2.1 % (ref 0.0–5.0)
HEMATOCRIT: 45.4 % (ref 39.0–52.0)
Hemoglobin: 15.4 g/dL (ref 13.0–17.0)
Lymphs Abs: 6.8 10*3/uL — ABNORMAL HIGH (ref 0.7–4.0)
MCHC: 34 g/dL (ref 30.0–36.0)
MCV: 86.2 fl (ref 78.0–100.0)
MONO ABS: 1 10*3/uL (ref 0.1–1.0)
MONOS PCT: 9.7 % (ref 3.0–12.0)
NEUTROS ABS: 2.5 10*3/uL (ref 1.4–7.7)
Neutrophils Relative %: 23.6 % — ABNORMAL LOW (ref 43.0–77.0)
Platelets: 155 10*3/uL (ref 150.0–400.0)
RBC: 5.27 Mil/uL (ref 4.22–5.81)
RDW: 13.3 % (ref 11.5–15.5)
WBC: 10.7 10*3/uL — ABNORMAL HIGH (ref 4.0–10.5)

## 2014-07-19 LAB — COMPREHENSIVE METABOLIC PANEL
ALT: 69 U/L — ABNORMAL HIGH (ref 0–53)
AST: 48 U/L — ABNORMAL HIGH (ref 0–37)
Albumin: 3.8 g/dL (ref 3.5–5.2)
Alkaline Phosphatase: 77 U/L (ref 39–117)
BILIRUBIN TOTAL: 0.6 mg/dL (ref 0.2–1.2)
BUN: 11 mg/dL (ref 6–23)
CHLORIDE: 102 meq/L (ref 96–112)
CO2: 32 mEq/L (ref 19–32)
Calcium: 9.1 mg/dL (ref 8.4–10.5)
Creatinine, Ser: 1.05 mg/dL (ref 0.40–1.50)
GFR: 90.21 mL/min (ref >60.00)
GLUCOSE: 92 mg/dL (ref 70–99)
Potassium: 4.4 mEq/L (ref 3.5–5.1)
SODIUM: 135 meq/L (ref 135–145)
Total Protein: 7.1 g/dL (ref 6.0–8.3)

## 2014-07-19 LAB — SEDIMENTATION RATE: SED RATE: 16 mm/h (ref 0–22)

## 2014-07-19 LAB — C-REACTIVE PROTEIN: CRP: 4.2 mg/dL (ref 0.5–20.0)

## 2014-07-19 MED ORDER — DOXYCYCLINE HYCLATE 100 MG PO TABS: 100.0000 mg | ORAL_TABLET | Freq: Two times a day (BID) | ORAL | Status: DC

## 2014-07-19 NOTE — Progress Notes (Signed)
Pre visit review using our clinic review tool, if applicable. No additional management support is needed unless otherwise documented below in the visit note. 

## 2014-07-19 NOTE — Patient Instructions (Signed)
Take tylenol 1000 mg every 6 hours for fever and body aches.  You may still take 2 alleve twice a day with food.  Wear clothing/hat to cover skin as much as you can while taking doxycycline.

## 2014-07-19 NOTE — Progress Notes (Signed)
Office Note 07/19/2014  CC:  Chief Complaint  Patient presents with  . Fever    HPI:  Devin Burton is a 29 y.o. White male who is here to transfer care and discuss recent concern of fevers. Patient's most recent primary MD: Dr. Benay Pillow at [facility] (retired from [employer]). Old records in EPIC/[facility] EMR were reviewed prior to or during today's visit.  Last visit in EMR 10/18/13 was hosp f/u for prolonged diarrhea illness, w/u unrevealing of cause. Initially there was plan for GI consult as outpt but sx's cleared up before this was needed.    For the last week he has had daily fever starting in the afternoons, up to 103.  In mornings he wakes up with dizziness that lasts about an hour.  Some nausea but no vomiting.  Body aches constantly, even HA.  Last couple days has noted some CP when lying supine and when he takes a deep breath.  No cough or ST. Slight runny nose. No SOB.   Some normal, formed stools and some mildly loose ones but this is his normal bowel habits.  No rash. Daughter with viral infection (fever and resp sx's) the week prior.  No tick bites recalled.  Past Medical History  Diagnosis Date  . ALLERGIC RHINITIS 05/28/2007  . GERD (gastroesophageal reflux disease)   . Hemorrhoid   . Abscess     left buttock  . Pilonidal cyst   . Headache(784.0)   . Pneumonia 3-4 years ago    Past Surgical History  Procedure Laterality Date  . Fracture surgery  2009    right wrist  . Tonsillectomy    . Pilonidal cyst excision  12/30/10  . Pilonidal cyst excision  04/08/2011    Procedure: CYST EXCISION PILONIDAL SIMPLE;  Surgeon: Imogene Burn. Georgette Dover, MD;  Location: [place] OR;  Service: General;  Laterality: N/A;  Excision of chronic pilonidal abscess    Family History  Problem Relation Age of Onset  . HIV Mother   . HIV Father     d. age 45 (HIV likely from blood transfusion given for bleeding assoc with MVA injuries)  . Colon cancer Neg Hx   . Prostate  cancer Neg Hx     History   Social History  . Marital Status: Single    Spouse Name: N/A  . Number of Children: N/A  . Years of Education: N/A   Occupational History  . [employer] History Main Topics  . Smoking status: Never Smoker   . Smokeless tobacco: Never Used  . Alcohol Use: Yes     Comment: 3 daily   . Drug Use: No  . Sexual Activity: Yes    Birth Control/ Protection: Other-see comments     Comment: girlfriend uses birth control   Other Topics Concern  . Not on file   Social History Narrative   Married, one daughter 89 mo (as of 06/2014).   Orig from [place], [place] from [employer].   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   Never smoker.  Occ alcohol.  No hx of alc or drug problem.   4 caffeine drinks daily     Outpatient Encounter Prescriptions as of 07/19/2014  Medication Sig  . omeprazole (PRILOSEC) 40 MG capsule Take 1 capsule (40 mg total) by mouth daily.  Marland Kitchen doxycycline (VIBRA-TABS) 100 MG tablet Take 1 tablet (100 mg total) by mouth 2 (two) times daily.  usually 3-4  doses per week. Allegra recently started. Taking alleve sometimes for the fever.  Allergies  Allergen Reactions  . Penicillins Anaphylaxis    Of throat  . Clarithromycin     REACTION: SOB, Palpitations  . Sulfamethoxazole-Trimethoprim     Patient unsure of reaction.    ROS see HPI; also Review of Systems  HENT: Negative for congestion, mouth sores and sore throat.   Eyes: Negative for pain, redness and visual disturbance.  Respiratory: Negative for cough.   Cardiovascular: Negative for palpitations.  Gastrointestinal: Negative for vomiting and abdominal pain.  Endocrine: Positive for heat intolerance. Negative for polydipsia, polyphagia and polyuria.  Genitourinary: Positive for decreased urine volume (and appears concentrated). Negative for dysuria and urgency.  Musculoskeletal: Positive for joint swelling and arthralgias.  Skin: Negative  for rash.  Neurological: Negative for weakness and headaches.  Hematological: Negative for adenopathy.    PE; Blood pressure 127/84, pulse 74, temperature 97.9 F (36.6 C), temperature source Oral, height 6' (1.829 m), weight 219 lb (99.338 kg), SpO2 98 %. Gen: Alert, well appearing.  Patient is oriented to person, place, time, and situation. AFFECT: pleasant, lucid thought and speech. ENT: Ears: EACs clear, normal epithelium.  TMs with good light reflex and landmarks bilaterally.  Eyes: no injection, icteris, swelling, or exudate.  EOMI, PERRLA. Nose: no drainage or turbinate edema/swelling.  No injection or focal lesion.  Mouth: lips without lesion/swelling.  Oral mucosa pink and moist.  Dentition intact and without obvious caries or gingival swelling.  Oropharynx without erythema, exudate, or swelling.  Neck: supple/nontender.  No LAD, mass, or TM.   CV: RRR, no m/r/g.   LUNGS: CTA bilat, nonlabored resps, good aeration in all lung fields. ABD: soft, NT, ND, BS normal.  No hepatospenomegaly or mass.  No bruits. EXT: no clubbing, cyanosis, or edema.  Musculoskeletal: no joint swelling, erythema, warmth, or tenderness.  ROM of all joints intact. Skin - no sores or suspicious lesions or rashes or color changes   Pertinent labs:  12 lead EKG today: NSR, rate 66, no ST segment abnormalities.  NORMAL EKG.   ASSESSMENT AND PLAN:   Prolonged febrile illness suspicious for tick born illness. Start doxy 100 mg bid x 14d.  Take antipyretic more regularly. Check CBC w/diff, CMET, ESR, CRP, Lyme Ab titers and RMSF Ab titers. F/u 5d in office.  An After Visit Summary was printed and given to the patient.  No Follow-up on file.

## 2014-07-20 LAB — PATHOLOGIST SMEAR REVIEW

## 2014-07-20 LAB — B. BURGDORFI ANTIBODIES: B burgdorferi Ab IgG+IgM: 0.9 {ISR}

## 2014-07-21 ENCOUNTER — Ambulatory Visit (INDEPENDENT_AMBULATORY_CARE_PROVIDER_SITE_OTHER): Payer: BLUE CROSS/BLUE SHIELD | Admitting: Family Medicine

## 2014-07-21 ENCOUNTER — Encounter: Payer: Self-pay | Admitting: Family Medicine

## 2014-07-21 ENCOUNTER — Other Ambulatory Visit: Payer: Self-pay | Admitting: Family Medicine

## 2014-07-21 ENCOUNTER — Ambulatory Visit (HOSPITAL_BASED_OUTPATIENT_CLINIC_OR_DEPARTMENT_OTHER)
Admission: RE | Admit: 2014-07-21 | Discharge: 2014-07-21 | Disposition: A | Discharge status: Home / Self Care | Payer: BLUE CROSS/BLUE SHIELD | Source: Ambulatory Visit | Attending: Family Medicine | Admitting: Family Medicine

## 2014-07-21 VITALS — BP 113/73 | HR 83 | Temp 98.6°F | Ht 72.0 in | Wt 221.0 lb

## 2014-07-21 DIAGNOSIS — R0789 Other chest pain: Secondary | ICD-10-CM | POA: Insufficient documentation

## 2014-07-21 DIAGNOSIS — R7401 Elevation of levels of liver transaminase levels: Secondary | ICD-10-CM

## 2014-07-21 DIAGNOSIS — R0609 Other forms of dyspnea: Secondary | ICD-10-CM

## 2014-07-21 DIAGNOSIS — R740 Nonspecific elevation of levels of transaminase and lactic acid dehydrogenase [LDH]: Secondary | ICD-10-CM

## 2014-07-21 DIAGNOSIS — D72820 Lymphocytosis (symptomatic): Secondary | ICD-10-CM

## 2014-07-21 DIAGNOSIS — R079 Chest pain, unspecified: Secondary | ICD-10-CM

## 2014-07-21 DIAGNOSIS — R509 Fever, unspecified: Secondary | ICD-10-CM

## 2014-07-21 DIAGNOSIS — R0602 Shortness of breath: Secondary | ICD-10-CM | POA: Diagnosis not present

## 2014-07-21 LAB — COMPREHENSIVE METABOLIC PANEL
ALK PHOS: 83 U/L (ref 39–117)
ALT: 76 U/L — AB (ref 0–53)
AST: 59 U/L — ABNORMAL HIGH (ref 0–37)
Albumin: 3.6 g/dL (ref 3.5–5.2)
BUN: 9 mg/dL (ref 6–23)
CO2: 30 meq/L (ref 19–32)
Calcium: 9 mg/dL (ref 8.4–10.5)
Chloride: 102 mEq/L (ref 96–112)
Creatinine, Ser: 1.05 mg/dL (ref 0.40–1.50)
GFR: 90.2 mL/min (ref >60.00)
Glucose, Bld: 93 mg/dL (ref 70–99)
POTASSIUM: 4.3 meq/L (ref 3.5–5.1)
SODIUM: 136 meq/L (ref 135–145)
TOTAL PROTEIN: 6.9 g/dL (ref 6.0–8.3)
Total Bilirubin: 0.6 mg/dL (ref 0.2–1.2)

## 2014-07-21 LAB — SEDIMENTATION RATE: Sed Rate: 18 mm/hr (ref 0–22)

## 2014-07-21 LAB — MONONUCLEOSIS SCREEN: Mono Screen: NEGATIVE

## 2014-07-21 MED ORDER — PROMETHAZINE HCL 12.5 MG PO TABS
ORAL_TABLET | ORAL | Status: DC
Start: 2014-07-21 — End: 2015-07-11

## 2014-07-21 MED ORDER — LEVOFLOXACIN 500 MG PO TABS: 500.0000 mg | ORAL_TABLET | Freq: Every day | ORAL | Status: DC

## 2014-07-21 NOTE — Progress Notes (Signed)
Pre visit review using our clinic review tool, if applicable. No additional management support is needed unless otherwise documented below in the visit note. 

## 2014-07-21 NOTE — Progress Notes (Signed)
OFFICE VISIT  07/21/2014   CC:  Chief Complaint  Patient presents with  . Follow-up   HPI:    Patient is a 29 y.o. Caucasian male who presents for 2 day f/u of recent acute febrile illness that we have been treating with empiric doxy for possible tick borne dz. Has been sick for over a week now. Still with fevers, achiness, generalized weakness and SOB with mild activity/exertion but none at rest.  Nauseated and cannot eat but vomiting and no diarrhea.  Stomach feels upset but no signif abd pain.  No rash.   Taking doxy as prescribed, plus tylenol q6h. Pain in chest with deep breaths and with lying supine is still there. Still no cough or ST or signif nasal sx's.  .  Past Medical History  Diagnosis Date  . ALLERGIC RHINITIS 05/28/2007  . GERD (gastroesophageal reflux disease)   . Hemorrhoid   . Abscess     left buttock  . Pilonidal cyst   . Headache(784.0)   . Pneumonia 3-4 years ago    Past Surgical History  Procedure Laterality Date  . Fracture surgery  2009    right wrist  . Tonsillectomy    . Pilonidal cyst excision  12/30/10  . Pilonidal cyst excision  04/08/2011    Procedure: CYST EXCISION PILONIDAL SIMPLE;  Surgeon: Wilmon ArmsMatthew K. Corliss Skainssuei, MD;  Location: [facility] OR;  Service: General;  Laterality: N/A;  Excision of chronic pilonidal abscess    Outpatient Prescriptions Prior to Visit  Medication Sig Dispense Refill  . doxycycline (VIBRA-TABS) 100 MG tablet Take 1 tablet (100 mg total) by mouth 2 (two) times daily. 28 tablet 0  . omeprazole (PRILOSEC) 40 MG capsule Take 1 capsule (40 mg total) by mouth daily. 90 capsule 3   No facility-administered medications prior to visit.    Allergies  Allergen Reactions  . Penicillins Anaphylaxis    Of throat  . Clarithromycin     REACTION: SOB, Palpitations  . Sulfamethoxazole-Trimethoprim     Patient unsure of reaction.    ROS As per HPI  PE: Blood pressure 113/73, pulse 83, temperature 98.6 F (37 C), temperature source  Oral, height 6' (1.829 m), weight 221 lb (100.245 kg), SpO2 95 %. Gen: Alert, well appearing.  Patient is oriented to person, place, time, and situation. ENT: Ears: EACs clear, normal epithelium.  TMs with good light reflex and landmarks bilaterally.  Eyes: no injection, icteris, swelling, or exudate.  EOMI, PERRLA. Nose: no drainage or turbinate edema/swelling.  No injection or focal lesion.  Mouth: lips without lesion/swelling.  Oral mucosa pink and moist.  Dentition intact and without obvious caries or gingival swelling.  Oropharynx without erythema, exudate, or swelling.  Neck - No masses or thyromegaly or limitation in range of motion CV: RRR, no m/r/g.   LUNGS: CTA bilat, nonlabored resps, good aeration in all lung fields. ABD: soft, NT, ND, BS normal.  No hepatospenomegaly or mass.  No bruits. EXT: no clubbing, cyanosis, or edema.  Skin - no sores or suspicious lesions or rashes or color changes No nuchal rigidity on Kernig's and brudzinski's testing today.   LABS:  Lab Results  Component Value Date   WBC 10.7* 07/19/2014   HGB 15.4 07/19/2014   HCT 45.4 07/19/2014   MCV 86.2 07/19/2014   PLT 155.0 07/19/2014     Chemistry      Component Value Date/Time   NA 135 07/19/2014 1457   K 4.4 07/19/2014 1457   CL 102 07/19/2014  1457   CO2 32 07/19/2014 1457   BUN 11 07/19/2014 1457   CREATININE 1.05 07/19/2014 1457      Component Value Date/Time   CALCIUM 9.1 07/19/2014 1457   ALKPHOS 77 07/19/2014 1457   AST 48* 07/19/2014 1457   ALT 69* 07/19/2014 1457   BILITOT 0.6 07/19/2014 1457     Lab Results  Component Value Date   ESRSEDRATE 16 07/19/2014   Lyme titer 0.9 on 07/19/14 RMSF titers still pending.  Periph blood smear: atypical lymphocytes, reactive suspected by pathologist.  IMPRESSION AND PLAN:  Ongoing febrile illness with generalized achiness + now nausea is a growing problem. He is drinking fluids well, but will add phenergan for prn use. Continue current  doxy and antipyretic, and will check a CXR as well as a few additional blood tests today (EBV testing, viral Hep testing, repeat of some inflamm markers, repeat CBC w/diff and CMET, also ANA.  An After Visit Summary was printed and given to the patient.  FOLLOW UP: Return for follow up to be determined based on results of work up.

## 2014-07-22 LAB — CBC WITH DIFFERENTIAL

## 2014-07-22 LAB — ANA W/REFLEX: Anti Nuclear Antibody(ANA): NEGATIVE

## 2014-07-22 LAB — HEPATITIS B SURFACE ANTIGEN: Hepatitis B Surface Ag: NEGATIVE

## 2014-07-22 LAB — HEPATITIS A ANTIBODY, TOTAL: Hep A Total Ab: BORDERLINE — AB

## 2014-07-22 LAB — HEPATITIS C ANTIBODY: HCV AB: NEGATIVE

## 2014-07-24 ENCOUNTER — Encounter: Payer: Self-pay | Admitting: Family Medicine

## 2014-07-24 ENCOUNTER — Ambulatory Visit (HOSPITAL_BASED_OUTPATIENT_CLINIC_OR_DEPARTMENT_OTHER)
Admission: RE | Admit: 2014-07-24 | Discharge: 2014-07-24 | Disposition: A | Discharge status: Home / Self Care | Payer: BLUE CROSS/BLUE SHIELD | Source: Ambulatory Visit | Attending: Family Medicine | Admitting: Family Medicine

## 2014-07-24 ENCOUNTER — Encounter: Payer: Self-pay | Admitting: *Deleted

## 2014-07-24 ENCOUNTER — Ambulatory Visit (INDEPENDENT_AMBULATORY_CARE_PROVIDER_SITE_OTHER): Payer: BLUE CROSS/BLUE SHIELD | Admitting: Family Medicine

## 2014-07-24 VITALS — BP 104/80 | HR 77 | Temp 98.9°F | Ht 72.0 in | Wt 216.0 lb

## 2014-07-24 DIAGNOSIS — R740 Nonspecific elevation of levels of transaminase and lactic acid dehydrogenase [LDH]: Secondary | ICD-10-CM | POA: Diagnosis not present

## 2014-07-24 DIAGNOSIS — R509 Fever, unspecified: Secondary | ICD-10-CM | POA: Diagnosis not present

## 2014-07-24 DIAGNOSIS — J189 Pneumonia, unspecified organism: Secondary | ICD-10-CM

## 2014-07-24 DIAGNOSIS — D72820 Lymphocytosis (symptomatic): Secondary | ICD-10-CM | POA: Diagnosis not present

## 2014-07-24 DIAGNOSIS — R7401 Elevation of levels of liver transaminase levels: Secondary | ICD-10-CM

## 2014-07-24 DIAGNOSIS — R079 Chest pain, unspecified: Secondary | ICD-10-CM | POA: Diagnosis not present

## 2014-07-24 LAB — EBV AB TO VIRAL CAPSID AG PNL, IGG+IGM
EBV VCA IGG: 388 U/mL — AB (ref <18.0)
EBV VCA IGM: 60.5 U/mL — AB (ref <36.0)

## 2014-07-24 LAB — ROCKY MTN SPOTTED FVR ABS PNL(IGG+IGM)
RMSF IGG: 0.1 IV
RMSF IgM: 0.86 IV

## 2014-07-24 LAB — EPSTEIN-BARR VIRUS NUCLEAR ANTIGEN ANTIBODY, IGG: EBV NA IGG: 236 U/mL — AB (ref <18.0)

## 2014-07-24 NOTE — Progress Notes (Signed)
Pre visit review using our clinic review tool, if applicable. No additional management support is needed unless otherwise documented below in the visit note. 

## 2014-07-24 NOTE — Progress Notes (Signed)
OFFICE NOTE  07/24/2014  CC: F/u febrile illness  HPI: Patient is a 29 y.o. Caucasian male who is here for 3d f/u febrile illness of unclear etiology. Has been on empiric doxy for possible tick-born dz for 5d and has been on levaquin for possibly early/mild pneumonia noted on CXR 3d/a.   Extensive blood work testing has showed only mild leukocytosis with atypical lymphs, mildly elevated AST and ALT. He is still having daily fevers but they don't seem as high (101 per pt, but he is not checking it as regularly as before).  Sternal and left chest wall area hurting constantly now, also tender to touch.  Pt still easily out of breath and dizzy.  He has not been taking it as easy as I would have hoped, has been trying to work some and gets dizzy easy.  He vomited 2 d/a after taking doxy, no vomiting since.  Mild nausea but not enough to make him take his phenergan.  Drinking PO fine, says appetite is returning and his body aches/HA are gone. No rash.    Pertinent PMH:  Past medical, surgical, social, and family history reviewed and no changes are noted since last office visit.  MEDS:  Outpatient Prescriptions Prior to Visit  Medication Sig Dispense Refill  . doxycycline (VIBRA-TABS) 100 MG tablet Take 1 tablet (100 mg total) by mouth 2 (two) times daily. 28 tablet 0  . levofloxacin (LEVAQUIN) 500 MG tablet Take 1 tablet (500 mg total) by mouth daily. 10 tablet 0  . omeprazole (PRILOSEC) 40 MG capsule Take 1 capsule (40 mg total) by mouth daily. 90 capsule 3  . promethazine (PHENERGAN) 12.5 MG tablet 1-2 tabs po q6h prn nausea 30 tablet 0  . promethazine (PHENERGAN) 25 MG tablet Take 1 tablet (25 mg total) by mouth every 6 (six) hours as needed for nausea. 10 tablet 0   No facility-administered medications prior to visit.    PE: Blood pressure 104/80, pulse 77, temperature 98.9 F (37.2 C), temperature source Oral, height 6' (1.829 m), weight 216 lb (97.977 kg), SpO2 98 %. Gen: Alert, well  appearing.  Patient is oriented to person, place, time, and situation. ENT: Eyes: no injection, icteris, swelling, or exudate.  EOMI, PERRLA. Nose: no drainage or turbinate edema/swelling.  No injection or focal lesion.  Mouth: lips without lesion/swelling.  Oral mucosa pink and moist.  Dentition intact and without obvious caries or gingival swelling.  Oropharynx without erythema, exudate, or swelling.  Neck - No masses or thyromegaly or limitation in range of motion CV: Regular, tachy to about 100-110, no m/r/g. Chest is clear, no wheezing or rales. Normal symmetric air entry throughout both lung fields. No chest wall deformities but he has chest wall tenderness diffusely on left side and over sternumb in distal 1/3.  No costochondral tenderness. ABD: soft, NT, ND, BS normal.  No hepatospenomegaly or mass.  No bruits. EXT: no clubbing, cyanosis, or edema.  Skin - no sores or suspicious lesions or rashes or color changes    LABS:  12 lead EKG today: NSR, rate 72, no ST segment abnormalities.  Poor R wave progression.  Normal voltages and intervals.  No ectopy.     Chemistry      Component Value Date/Time   NA 136 07/21/2014 1335   K 4.3 07/21/2014 1335   CL 102 07/21/2014 1335   CO2 30 07/21/2014 1335   BUN 9 07/21/2014 1335   CREATININE 1.05 07/21/2014 1335  Component Value Date/Time   CALCIUM 9.0 07/21/2014 1335   ALKPHOS 83 07/21/2014 1335   AST 59* 07/21/2014 1335   ALT 76* 07/21/2014 1335   BILITOT 0.6 07/21/2014 1335     Lab Results  Component Value Date   WBC CANCELED 07/21/2014   HGB CANCELED 07/21/2014   HCT CANCELED 07/21/2014   MCV 86.2 07/19/2014   PLT 155.0 07/19/2014   Lab Results  Component Value Date   ESRSEDRATE 18 07/21/2014   Lab Results  Component Value Date   CRP 4.2 07/19/2014   Monospot neg 07/21/14 Borrelia burg antibodies borderline. RMSF ab panel was negative. EBV titers showed evidence of past infection but not active infection. ANA  neg.   IMPRESSION AND PLAN:  Ongoing febrile illness of unclear etiology. Lab abnormalities nonspecific.  Some clinical aspects/symptoms are improving but others persist and others are worsening. Will repeat CBC and path review of smear, repeat CMET, repeat CXR today. Continue current meds, refer to ID clinic. Push fluids.  Needs to rest more, out of work all this week: note written.  An After Visit Summary was printed and given to the patient.  FOLLOW UP: 3--4 days

## 2014-07-25 ENCOUNTER — Telehealth: Payer: Self-pay | Admitting: *Deleted

## 2014-07-25 LAB — CBC WITH DIFFERENTIAL/PLATELET
Basophils Absolute: 0.1 10*3/uL (ref 0.0–0.1)
Basophils Relative: 0.5 % (ref 0.0–3.0)
Eosinophils Absolute: 0.3 10*3/uL (ref 0.0–0.7)
Eosinophils Relative: 2 % (ref 0.0–5.0)
HEMATOCRIT: 48 % (ref 39.0–52.0)
HEMOGLOBIN: 15.9 g/dL (ref 13.0–17.0)
LYMPHS ABS: 9.6 10*3/uL — AB (ref 0.7–4.0)
LYMPHS PCT: 64.4 % — AB (ref 12.0–46.0)
MCHC: 33.2 g/dL (ref 30.0–36.0)
MCV: 87.5 fl (ref 78.0–100.0)
Monocytes Absolute: 1.4 10*3/uL — ABNORMAL HIGH (ref 0.1–1.0)
Monocytes Relative: 9.1 % (ref 3.0–12.0)
NEUTROS ABS: 3.6 10*3/uL (ref 1.4–7.7)
Neutrophils Relative %: 24 % — ABNORMAL LOW (ref 43.0–77.0)
Platelets: 178 10*3/uL (ref 150.0–400.0)
RBC: 5.48 Mil/uL (ref 4.22–5.81)
RDW: 13.5 % (ref 11.5–15.5)
WBC: 14.8 10*3/uL — ABNORMAL HIGH (ref 4.0–10.5)

## 2014-07-25 LAB — COMPREHENSIVE METABOLIC PANEL
ALBUMIN: 3.9 g/dL (ref 3.5–5.2)
ALT: 195 U/L — ABNORMAL HIGH (ref 0–53)
AST: 121 U/L — ABNORMAL HIGH (ref 0–37)
Alkaline Phosphatase: 94 U/L (ref 39–117)
BUN: 10 mg/dL (ref 6–23)
CALCIUM: 9.1 mg/dL (ref 8.4–10.5)
CHLORIDE: 101 meq/L (ref 96–112)
CO2: 30 mEq/L (ref 19–32)
Creatinine, Ser: 0.94 mg/dL (ref 0.40–1.50)
GFR: 102.48 mL/min (ref >60.00)
GLUCOSE: 89 mg/dL (ref 70–99)
POTASSIUM: 4.5 meq/L (ref 3.5–5.1)
Sodium: 136 mEq/L (ref 135–145)
Total Bilirubin: 0.6 mg/dL (ref 0.2–1.2)
Total Protein: 7.5 g/dL (ref 6.0–8.3)

## 2014-07-25 LAB — PATHOLOGIST SMEAR REVIEW

## 2014-07-25 NOTE — Telephone Encounter (Signed)
Patient wanted to know if his Levaquin dose could reduced from 500 mg? Patient stated that when he doesn't take the med he feels fine but when he does take it he feels dizzy.

## 2014-07-25 NOTE — Telephone Encounter (Signed)
LMOVM for pt to return call 

## 2014-07-25 NOTE — Telephone Encounter (Signed)
Yes, he can split the tablet in half and take 1/2 tab a day. If tab is too small or he is unable to split it in half for some reason, then pls eRx, levofloxacin 250mg , 1 tab po qd x 7d, #7, no RF.-thx

## 2014-07-25 NOTE — Telephone Encounter (Signed)
Patient notified

## 2014-07-28 ENCOUNTER — Encounter: Payer: Self-pay | Admitting: Family Medicine

## 2014-07-28 ENCOUNTER — Ambulatory Visit (INDEPENDENT_AMBULATORY_CARE_PROVIDER_SITE_OTHER): Payer: BLUE CROSS/BLUE SHIELD | Admitting: Family Medicine

## 2014-07-28 ENCOUNTER — Encounter: Payer: Self-pay | Admitting: *Deleted

## 2014-07-28 VITALS — BP 112/62 | HR 87 | Temp 97.9°F | Ht 72.0 in | Wt 214.0 lb

## 2014-07-28 DIAGNOSIS — R740 Nonspecific elevation of levels of transaminase and lactic acid dehydrogenase [LDH]: Secondary | ICD-10-CM | POA: Diagnosis not present

## 2014-07-28 DIAGNOSIS — R7401 Elevation of levels of liver transaminase levels: Secondary | ICD-10-CM | POA: Insufficient documentation

## 2014-07-28 DIAGNOSIS — R509 Fever, unspecified: Secondary | ICD-10-CM | POA: Diagnosis not present

## 2014-07-28 DIAGNOSIS — D72820 Lymphocytosis (symptomatic): Secondary | ICD-10-CM | POA: Insufficient documentation

## 2014-07-28 NOTE — Progress Notes (Signed)
OFFICE VISIT  07/28/2014   CC:  Chief Complaint  Patient presents with  . Follow-up   HPI:    Patient is a 29 y.o. Caucasian male who presents for 4d f/u febrile illness of unclear etiology. Fevers gone since I last saw him, feeling improved overall but still with intermittent random lightheaded feeling that he associates with taking the levaquin, even at 1/2 dosing.  Still with L sided CP when he takes a deep breath. No new sx's.   Still no rash, SOB, cough, n/v/d.  No HA, no neck stiffness. He is hydrating very well, says urine is nearly clear in color.  He is accompanied by his mom today.  She has some questions and wants to make sure she is advocating for him. Reviewed the positive/abnormal labs with pt and mom, plan for recheck, gave mom a copy of these labs per her request and with pt's permission. Pt has ID consult set for 08/09/14.   Past Medical History  Diagnosis Date  . ALLERGIC RHINITIS 05/28/2007  . GERD (gastroesophageal reflux disease)   . Hemorrhoid   . Abscess     left buttock  . Pilonidal cyst   . Headache(784.0)   . Pneumonia 3-4 years ago    Past Surgical History  Procedure Laterality Date  . Fracture surgery  2009    right wrist  . Tonsillectomy    . Pilonidal cyst excision  12/30/10  . Pilonidal cyst excision  04/08/2011    Procedure: CYST EXCISION PILONIDAL SIMPLE;  Surgeon: Wilmon ArmsMatthew K. Corliss Skainssuei, MD;  Location: [facility] OR;  Service: General;  Laterality: N/A;  Excision of chronic pilonidal abscess    Outpatient Prescriptions Prior to Visit  Medication Sig Dispense Refill  . doxycycline (VIBRA-TABS) 100 MG tablet Take 1 tablet (100 mg total) by mouth 2 (two) times daily. 28 tablet 0  . levofloxacin (LEVAQUIN) 500 MG tablet Take 1 tablet (500 mg total) by mouth daily. 10 tablet 0  . omeprazole (PRILOSEC) 40 MG capsule Take 1 capsule (40 mg total) by mouth daily. 90 capsule 3  . promethazine (PHENERGAN) 12.5 MG tablet 1-2 tabs po q6h prn nausea 30 tablet 0    No facility-administered medications prior to visit.    Allergies  Allergen Reactions  . Penicillins Anaphylaxis    Of throat  . Clarithromycin     REACTION: SOB, Palpitations  . Sulfamethoxazole-Trimethoprim     Patient unsure of reaction.    ROS As per HPI  PE: Blood pressure 112/62, pulse 87, temperature 97.9 F (36.6 C), temperature source Oral, height 6' (1.829 m), weight 214 lb (97.07 kg), SpO2 99 %. Gen: Alert, well appearing.  Patient is oriented to person, place, time, and situation. ENT: Ears: EACs clear, normal epithelium.  TMs with good light reflex and landmarks bilaterally.  Eyes: no injection, icteris, swelling, or exudate.  EOMI, PERRLA. Nose: no drainage or turbinate edema/swelling.  No injection or focal lesion.  Mouth: lips without lesion/swelling.  Oral mucosa pink and moist.  Dentition intact and without obvious caries or gingival swelling.  Oropharynx without erythema, exudate, or swelling.  Neck - No masses or thyromegaly or limitation in range of motion CV: RRR, no m/r/g.   LUNGS: CTA bilat, nonlabored resps, good aeration in all lung fields. No chest wall tenderness. ABD: soft, NT/ND EXT: no clubbing, cyanosis, or edema.  Skin - no sores or suspicious lesions or rashes or color changes   LABS:    Chemistry      Component  Value Date/Time   NA 136 07/24/2014 1657   K 4.5 07/24/2014 1657   CL 101 07/24/2014 1657   CO2 30 07/24/2014 1657   BUN 10 07/24/2014 1657   CREATININE 0.94 07/24/2014 1657      Component Value Date/Time   CALCIUM 9.1 07/24/2014 1657   ALKPHOS 94 07/24/2014 1657   AST 121* 07/24/2014 1657   ALT 195* 07/24/2014 1657   BILITOT 0.6 07/24/2014 1657     Lab Results  Component Value Date   WBC 14.8* 07/24/2014   HGB 15.9 07/24/2014   HCT 48.0 07/24/2014   MCV 87.5 07/24/2014   PLT 178.0 07/24/2014   IMPRESSION AND PLAN:  Febrile illness, with essentially normal lab workup except elevated AST/ALT and atypical  lymphocytosis. He has significantly improved the last 3-4 d, has been on doxy for at least 9d (and 3 days past the most recent infection). Will d/c this today. Also, levaquin seems to be causing too much lightheaded side effect, and since his CXR had only a subtle question of infiltrate (and f/u CXR 3d later was normal) I'll go ahead and d/c this med now as well.  Plan is to go ahead and return to work on Monday, 07/31/14, and return to office if illness begins to worsen again. Otherwise he'll return in 1 wk for repeat CBC, CMET, and periph smear review. He'll go to ID consult on 08/09/15 and we'll see if they can add any insight into what he has been infected with.  An After Visit Summary was printed and given to the patient.  FOLLOW UP: Return for office f/u prn, return for lab visit in 1 week (ordered).

## 2014-07-28 NOTE — Progress Notes (Signed)
Pre visit review using our clinic review tool, if applicable. No additional management support is needed unless otherwise documented below in the visit note. 

## 2014-08-04 ENCOUNTER — Other Ambulatory Visit (INDEPENDENT_AMBULATORY_CARE_PROVIDER_SITE_OTHER): Payer: BLUE CROSS/BLUE SHIELD

## 2014-08-04 ENCOUNTER — Other Ambulatory Visit: Payer: Self-pay | Admitting: Family Medicine

## 2014-08-04 DIAGNOSIS — R7401 Elevation of levels of liver transaminase levels: Secondary | ICD-10-CM

## 2014-08-04 DIAGNOSIS — R509 Fever, unspecified: Secondary | ICD-10-CM

## 2014-08-04 DIAGNOSIS — D72820 Lymphocytosis (symptomatic): Secondary | ICD-10-CM

## 2014-08-04 DIAGNOSIS — R740 Nonspecific elevation of levels of transaminase and lactic acid dehydrogenase [LDH]: Secondary | ICD-10-CM

## 2014-08-05 LAB — CBC WITH DIFFERENTIAL/PLATELET
BASOS PCT: 1 % (ref 0–1)
Basophils Absolute: 0.1 10*3/uL (ref 0.0–0.1)
EOS ABS: 0.1 10*3/uL (ref 0.0–0.7)
Eosinophils Relative: 1 % (ref 0–5)
HCT: 43.8 % (ref 39.0–52.0)
HEMOGLOBIN: 14.5 g/dL (ref 13.0–17.0)
LYMPHS ABS: 6.1 10*3/uL — AB (ref 0.7–4.0)
LYMPHS PCT: 62 % — AB (ref 12–46)
MCH: 28.6 pg (ref 26.0–34.0)
MCHC: 33.1 g/dL (ref 30.0–36.0)
MCV: 86.4 fL (ref 78.0–100.0)
MONOS PCT: 7 % (ref 3–12)
MPV: 10.7 fL (ref 8.6–12.4)
Monocytes Absolute: 0.7 10*3/uL (ref 0.1–1.0)
NEUTROS ABS: 2.8 10*3/uL (ref 1.7–7.7)
NEUTROS PCT: 29 % — AB (ref 43–77)
Platelets: 313 10*3/uL (ref 150–400)
RBC: 5.07 MIL/uL (ref 4.22–5.81)
RDW: 13.8 % (ref 11.5–15.5)
WBC: 9.8 10*3/uL (ref 4.0–10.5)

## 2014-08-05 LAB — COMPREHENSIVE METABOLIC PANEL
ALBUMIN: 4.2 g/dL (ref 3.5–5.2)
ALK PHOS: 74 U/L (ref 39–117)
ALT: 54 U/L — ABNORMAL HIGH (ref 0–53)
AST: 39 U/L — AB (ref 0–37)
BUN: 8 mg/dL (ref 6–23)
CO2: 25 meq/L (ref 19–32)
CREATININE: 0.92 mg/dL (ref 0.50–1.35)
Calcium: 9.1 mg/dL (ref 8.4–10.5)
Chloride: 104 mEq/L (ref 96–112)
Glucose, Bld: 87 mg/dL (ref 70–99)
Potassium: 4.7 mEq/L (ref 3.5–5.3)
Sodium: 140 mEq/L (ref 135–145)
TOTAL PROTEIN: 7.1 g/dL (ref 6.0–8.3)
Total Bilirubin: 0.5 mg/dL (ref 0.2–1.2)

## 2014-08-07 LAB — PATHOLOGIST SMEAR REVIEW

## 2014-08-09 ENCOUNTER — Ambulatory Visit: Payer: BLUE CROSS/BLUE SHIELD | Admitting: Infectious Diseases

## 2015-07-11 ENCOUNTER — Encounter: Payer: Self-pay | Admitting: Family Medicine

## 2015-07-11 ENCOUNTER — Ambulatory Visit (INDEPENDENT_AMBULATORY_CARE_PROVIDER_SITE_OTHER): Payer: BLUE CROSS/BLUE SHIELD | Admitting: Family Medicine

## 2015-07-11 VITALS — BP 141/84 | HR 90 | Temp 98.9°F | Resp 16 | Ht 72.0 in | Wt 218.2 lb

## 2015-07-11 DIAGNOSIS — J111 Influenza due to unidentified influenza virus with other respiratory manifestations: Secondary | ICD-10-CM

## 2015-07-11 DIAGNOSIS — R69 Illness, unspecified: Principal | ICD-10-CM

## 2015-07-11 MED ORDER — OSELTAMIVIR PHOSPHATE 75 MG PO CAPS: 75.0000 mg | ORAL_CAPSULE | Freq: Two times a day (BID) | ORAL | Status: DC

## 2015-07-11 NOTE — Progress Notes (Signed)
Pre visit review using our clinic review tool, if applicable. No additional management support is needed unless otherwise documented below in the visit note. 

## 2015-07-11 NOTE — Patient Instructions (Signed)
Get otc generic robitussin DM OR Mucinex DM and use as directed on the packaging for cough and congestion. Use otc generic saline nasal spray 2-3 times per day to irrigate/moisturize your nasal passages.   

## 2015-07-11 NOTE — Progress Notes (Signed)
OFFICE VISIT  07/11/2015   CC:  Chief Complaint  Patient presents with  . Cough    with body aches x 1 day, wife and daughter were both Dx with flu 2 days ago   HPI:    Patient is a 30 y.o. Caucasian male who presents for cough and body aches x almost 24h.  No fever. Scratchy throat and HA.  No n/v/d.  Daughter tested + for flu in the last few days.  Wife with similar illness. Took otc cough med this morning.  No tyl or motrin.  Past Medical History  Diagnosis Date  . ALLERGIC RHINITIS 05/28/2007  . GERD (gastroesophageal reflux disease)   . Hemorrhoid   . Abscess     left buttock  . Pilonidal cyst   . Headache(784.0)   . Pneumonia 3-4 years ago    Past Surgical History  Procedure Laterality Date  . Fracture surgery  2009    right wrist  . Tonsillectomy    . Pilonidal cyst excision  12/30/10  . Pilonidal cyst excision  04/08/2011    Procedure: CYST EXCISION PILONIDAL SIMPLE;  Surgeon: Wilmon ArmsMatthew K. Corliss Skainssuei, MD;  Location: [facility] OR;  Service: General;  Laterality: N/A;  Excision of chronic pilonidal abscess    Outpatient Prescriptions Prior to Visit  Medication Sig Dispense Refill  . omeprazole (PRILOSEC) 40 MG capsule Take 1 capsule (40 mg total) by mouth daily. 90 capsule 3  . promethazine (PHENERGAN) 12.5 MG tablet 1-2 tabs po q6h prn nausea (Patient not taking: Reported on 07/11/2015) 30 tablet 0   No facility-administered medications prior to visit.    Allergies  Allergen Reactions  . Penicillins Anaphylaxis    Of throat  . Clarithromycin     REACTION: SOB, Palpitations  . Sulfamethoxazole-Trimethoprim     Patient unsure of reaction.  . Levaquin [Levofloxacin] Other (See Comments)    dizzy    ROS As per HPI  PE: Blood pressure 141/84, pulse 90, temperature 98.9 F (37.2 C), temperature source Oral, resp. rate 16, height 6' (1.829 m), weight 218 lb 4 oz (98.998 kg), SpO2 97 %. VS: noted--normal. Gen: alert, NAD, NONTOXIC APPEARING. HEENT: eyes without  injection, drainage, or swelling.  Ears: EACs clear, TMs with normal light reflex and landmarks.  Nose: Clear rhinorrhea, with some dried, crusty exudate adherent to mildly injected mucosa.  No purulent d/c.  No paranasal sinus TTP.  No facial swelling.  Throat and mouth without focal lesion.  No pharyngial swelling, erythema, or exudate.   Neck: supple, no LAD.   LUNGS: CTA bilat, nonlabored resps.   CV: RRR, no m/r/g. EXT: no c/c/e SKIN: no rash  LABS:  none  IMPRESSION AND PLAN:  Influenza -like illness. Daughter with + flu test. Empiric tamiflu 75mg  bid x 5d rx'd today. Push fluids, rest, note for work written today. Get otc generic robitussin DM OR Mucinex DM and use as directed on the packaging for cough and congestion. Use otc generic saline nasal spray 2-3 times per day to irrigate/moisturize your nasal passages.  An After Visit Summary was printed and given to the patient.  FOLLOW UP: Return if symptoms worsen or fail to improve.

## 2015-11-22 ENCOUNTER — Telehealth: Payer: Self-pay | Admitting: Family Medicine

## 2015-11-22 DIAGNOSIS — Z3009 Encounter for other general counseling and advice on contraception: Secondary | ICD-10-CM

## 2015-11-22 NOTE — Telephone Encounter (Signed)
[facility] referral ordered.

## 2015-11-22 NOTE — Telephone Encounter (Signed)
Pt advised and voiced understanding.   

## 2015-11-22 NOTE — Telephone Encounter (Signed)
Please advise. Thanks.  

## 2015-11-22 NOTE — Telephone Encounter (Signed)
Pt is scheduled to have a vasectomy tomorrow at [facility] with Dr Terance Hart. They are requesting a referral from his PCP.

## 2015-11-30 ENCOUNTER — Ambulatory Visit: Payer: Self-pay | Admitting: General Surgery

## 2015-12-13 ENCOUNTER — Encounter (HOSPITAL_COMMUNITY): Payer: Self-pay

## 2015-12-13 NOTE — Patient Instructions (Addendum)
Devin Burton  12/13/2015   Your procedure is scheduled on: Tuesday 12/18/2015  [facility] 3[facility] to  Short Stay Center at   [phone].  Call this number if you have problems the morning of surgery [phone]   Remember: ONLY 1 PERSON MAY GO WITH YOU TO SHORT STAY TO GET  READY MORNING OF YOUR SURGERY.  Do not eat food or drink liquids :After Midnight.     Take these medicines the morning of surgery with A SIP OF WATER: Omeprazole                                  You may not have any metal on your body including hair pins and              piercings  Do not wear jewelry, make-up, lotions, powders or perfumes, deodorant             Do not wear nail polish.  Do not shave  48 hours prior to surgery.              Men may shave face and neck.   Do not bring valuables to the hospital. [facility] IS NOT             RESPONSIBLE   FOR VALUABLES.  Contacts, dentures or bridgework may not be worn into surgery.  Leave suitcase in the car. After surgery it may be brought to your room.     Patients discharged the day of surgery will not be allowed to drive home.  Name and phone number of your driver:spouse- Devin Burton  Special Instructions: N/A              Please read over the following fact sheets you were given: _____________________________________________________________________             [facility] - Pre[facility] Before surgery, you can play an important role.  Because skin is not sterile, your skin needs to be as free of germs as possible.  You can reduce the number of germs on your skin by washing with CHG (chlorahexidine gluconate) soap before surgery.  CHG is an antiseptic cleaner which kills germs and bonds with the skin to continue killing germs even after washing. Please DO NOT use if you have an allergy to CHG or antibacterial soaps.  If your skin becomes reddened/irritated stop using the  CHG and inform your nurse when you arrive at Short Stay. Do not shave (including legs and underarms) for at least 48 hours prior to the first CHG shower.  You may shave your face/neck. Please follow these instructions carefully:  1.  Shower with CHG Soap the night before surgery and the  morning of Surgery.  2.  If you choose to wash your hair, wash your hair first as usual with your  normal  shampoo.  3.  After you shampoo, rinse your hair and body thoroughly to remove the  shampoo.                           4.  Use CHG as you would any other liquid soap.  You can apply chg directly  to the skin and wash  Gently with a scrungie or clean washcloth.  5.  Apply the CHG Soap to your body ONLY FROM THE NECK DOWN.   Do not use on face/ open                           Wound or open sores. Avoid contact with eyes, ears mouth and genitals (private parts).                       Wash face,  Genitals (private parts) with your normal soap.             6.  Wash thoroughly, paying special attention to the area where your surgery  will be performed.  7.  Thoroughly rinse your body with warm water from the neck down.  8.  DO NOT shower/wash with your normal soap after using and rinsing off  the CHG Soap.                9.  Pat yourself dry with a clean towel.            10.  Wear clean pajamas.            11.  Place clean sheets on your bed the night of your first shower and do not  sleep with pets. Day of Surgery : Do not apply any lotions/deodorants the morning of surgery.  Please wear clean clothes to the hospital/surgery center.  FAILURE TO FOLLOW THESE INSTRUCTIONS MAY RESULT IN THE CANCELLATION OF YOUR SURGERY PATIENT SIGNATURE_________________________________  NURSE SIGNATURE__________________________________  ________________________________________________________________________

## 2015-12-14 ENCOUNTER — Ambulatory Visit: Payer: Self-pay | Admitting: General Surgery

## 2015-12-14 ENCOUNTER — Encounter (HOSPITAL_COMMUNITY)
Admission: RE | Admit: 2015-12-14 | Discharge: 2015-12-14 | Disposition: A | Discharge status: Home / Self Care | Payer: BLUE CROSS/BLUE SHIELD | Source: Ambulatory Visit | Attending: General Surgery | Admitting: General Surgery

## 2015-12-14 ENCOUNTER — Encounter (HOSPITAL_COMMUNITY): Payer: Self-pay

## 2015-12-14 DIAGNOSIS — Z01812 Encounter for preprocedural laboratory examination: Secondary | ICD-10-CM | POA: Insufficient documentation

## 2015-12-14 HISTORY — DX: Nausea with vomiting, unspecified: R11.2

## 2015-12-14 HISTORY — DX: Other specified postprocedural states: Z98.890

## 2015-12-14 LAB — CBC
HCT: 44.4 % (ref 39.0–52.0)
HEMOGLOBIN: 15.2 g/dL (ref 13.0–17.0)
MCH: 29.9 pg (ref 26.0–34.0)
MCHC: 34.2 g/dL (ref 30.0–36.0)
MCV: 87.2 fL (ref 78.0–100.0)
PLATELETS: 240 10*3/uL (ref 150–400)
RBC: 5.09 MIL/uL (ref 4.22–5.81)
RDW: 12.9 % (ref 11.5–15.5)
WBC: 7.4 10*3/uL (ref 4.0–10.5)

## 2015-12-17 MED ORDER — GENTAMICIN SULFATE 40 MG/ML IJ SOLN
480.0000 mg | INTRAVENOUS | Status: AC
  Administered 2015-12-18: 480 mg via INTRAVENOUS
  Filled 2015-12-17 (×2): qty 12

## 2015-12-18 ENCOUNTER — Ambulatory Visit (HOSPITAL_COMMUNITY): Payer: BLUE CROSS/BLUE SHIELD | Admitting: Anesthesiology

## 2015-12-18 ENCOUNTER — Encounter (HOSPITAL_COMMUNITY): Payer: Self-pay | Admitting: *Deleted

## 2015-12-18 ENCOUNTER — Ambulatory Visit (HOSPITAL_COMMUNITY)
Admission: RE | Admit: 2015-12-18 | Discharge: 2015-12-18 | Disposition: A | Discharge status: Home / Self Care | Payer: BLUE CROSS/BLUE SHIELD | Source: Ambulatory Visit | Attending: General Surgery | Admitting: General Surgery

## 2015-12-18 ENCOUNTER — Encounter (HOSPITAL_COMMUNITY)
Admission: RE | Disposition: A | Discharge status: Home / Self Care | Payer: Self-pay | Source: Ambulatory Visit | Attending: General Surgery

## 2015-12-18 DIAGNOSIS — Z79899 Other long term (current) drug therapy: Secondary | ICD-10-CM | POA: Insufficient documentation

## 2015-12-18 DIAGNOSIS — F17290 Nicotine dependence, other tobacco product, uncomplicated: Secondary | ICD-10-CM | POA: Diagnosis not present

## 2015-12-18 DIAGNOSIS — Z7951 Long term (current) use of inhaled steroids: Secondary | ICD-10-CM | POA: Diagnosis not present

## 2015-12-18 DIAGNOSIS — K219 Gastro-esophageal reflux disease without esophagitis: Secondary | ICD-10-CM | POA: Diagnosis not present

## 2015-12-18 DIAGNOSIS — K4020 Bilateral inguinal hernia, without obstruction or gangrene, not specified as recurrent: Secondary | ICD-10-CM | POA: Insufficient documentation

## 2015-12-18 HISTORY — PX: INSERTION OF MESH: SHX5868

## 2015-12-18 HISTORY — PX: INGUINAL HERNIA REPAIR: SHX194

## 2015-12-18 SURGERY — REPAIR, HERNIA, INGUINAL, BILATERAL, LAPAROSCOPIC
Anesthesia: General

## 2015-12-18 MED ORDER — IBUPROFEN 800 MG PO TABS: 800.0000 mg | ORAL_TABLET | Freq: Three times a day (TID) | ORAL | 0 refills | Status: DC | PRN

## 2015-12-18 MED ORDER — ACETAMINOPHEN 500 MG PO TABS
1000.0000 mg | ORAL_TABLET | ORAL | Status: AC
  Administered 2015-12-18: 1000 mg via ORAL
  Filled 2015-12-18: qty 2

## 2015-12-18 MED ORDER — PROMETHAZINE HCL 25 MG/ML IJ SOLN: 6.2500 mg | INTRAMUSCULAR | Status: DC | PRN

## 2015-12-18 MED ORDER — SUGAMMADEX SODIUM 200 MG/2ML IV SOLN
INTRAVENOUS | Status: DC | PRN
  Administered 2015-12-18: 200 mg via INTRAVENOUS

## 2015-12-18 MED ORDER — LACTATED RINGERS IV SOLN
INTRAVENOUS | Status: DC
  Administered 2015-12-18 (×2): via INTRAVENOUS
  Administered 2015-12-18: 1000 mL via INTRAVENOUS

## 2015-12-18 MED ORDER — MIDAZOLAM HCL 2 MG/2ML IJ SOLN
INTRAMUSCULAR | Status: AC
  Filled 2015-12-18: qty 2

## 2015-12-18 MED ORDER — FENTANYL CITRATE (PF) 250 MCG/5ML IJ SOLN
INTRAMUSCULAR | Status: AC
Start: 2015-12-18 — End: 2015-12-18
  Filled 2015-12-18: qty 5

## 2015-12-18 MED ORDER — SODIUM CHLORIDE 0.9 % IJ SOLN
INTRAMUSCULAR | Status: AC
  Filled 2015-12-18: qty 20

## 2015-12-18 MED ORDER — HYDROCODONE-ACETAMINOPHEN 5-325 MG PO TABS: 1.0000 | ORAL_TABLET | ORAL | Status: DC | PRN

## 2015-12-18 MED ORDER — HYDROCODONE-ACETAMINOPHEN 5-325 MG PO TABS
1.0000 | ORAL_TABLET | Freq: Four times a day (QID) | ORAL | 0 refills | Status: DC | PRN
Start: 2015-12-18 — End: 2016-07-01

## 2015-12-18 MED ORDER — LACTATED RINGERS IR SOLN
Status: DC | PRN
  Administered 2015-12-18: 1000 mL

## 2015-12-18 MED ORDER — BUPIVACAINE LIPOSOME 1.3 % IJ SUSP
20.0000 mL | Freq: Once | INTRAMUSCULAR | Status: DC
  Filled 2015-12-18: qty 20

## 2015-12-18 MED ORDER — LIDOCAINE HCL (CARDIAC) 20 MG/ML IV SOLN
INTRAVENOUS | Status: DC | PRN
  Administered 2015-12-18: 40 mg via INTRAVENOUS

## 2015-12-18 MED ORDER — OXYCODONE HCL 5 MG PO TABS
5.0000 mg | ORAL_TABLET | ORAL | Status: DC | PRN
  Administered 2015-12-18: 5 mg via ORAL
  Filled 2015-12-18: qty 1

## 2015-12-18 MED ORDER — ONDANSETRON HCL 4 MG/2ML IJ SOLN
INTRAMUSCULAR | Status: AC
  Filled 2015-12-18: qty 2

## 2015-12-18 MED ORDER — BUPIVACAINE LIPOSOME 1.3 % IJ SUSP
INTRAMUSCULAR | Status: DC | PRN
  Administered 2015-12-18: 20 mL

## 2015-12-18 MED ORDER — FENTANYL CITRATE (PF) 250 MCG/5ML IJ SOLN
INTRAMUSCULAR | Status: DC | PRN
  Administered 2015-12-18: 50 ug via INTRAVENOUS
  Administered 2015-12-18 (×2): 100 ug via INTRAVENOUS

## 2015-12-18 MED ORDER — PROPOFOL 10 MG/ML IV BOLUS
INTRAVENOUS | Status: AC
Start: 2015-12-18 — End: 2015-12-18
  Filled 2015-12-18: qty 20

## 2015-12-18 MED ORDER — CELECOXIB 200 MG PO CAPS
400.0000 mg | ORAL_CAPSULE | ORAL | Status: AC
  Administered 2015-12-18: 400 mg via ORAL
  Filled 2015-12-18: qty 2

## 2015-12-18 MED ORDER — MIDAZOLAM HCL 2 MG/2ML IJ SOLN
INTRAMUSCULAR | Status: DC | PRN
  Administered 2015-12-18: 2 mg via INTRAVENOUS

## 2015-12-18 MED ORDER — GABAPENTIN 300 MG PO CAPS
300.0000 mg | ORAL_CAPSULE | ORAL | Status: AC
  Administered 2015-12-18: 300 mg via ORAL
  Filled 2015-12-18: qty 1

## 2015-12-18 MED ORDER — SODIUM CHLORIDE 0.9 % IJ SOLN
INTRAMUSCULAR | Status: DC | PRN
  Administered 2015-12-18: 20 mL via INTRAVENOUS

## 2015-12-18 MED ORDER — ONDANSETRON HCL 4 MG/2ML IJ SOLN
INTRAMUSCULAR | Status: DC | PRN
  Administered 2015-12-18: 4 mg via INTRAVENOUS

## 2015-12-18 MED ORDER — ROCURONIUM BROMIDE 100 MG/10ML IV SOLN
INTRAVENOUS | Status: DC | PRN
  Administered 2015-12-18: 10 mg via INTRAVENOUS
  Administered 2015-12-18: 60 mg via INTRAVENOUS

## 2015-12-18 MED ORDER — 0.9 % SODIUM CHLORIDE (POUR BTL) OPTIME
TOPICAL | Status: DC | PRN
  Administered 2015-12-18: 1000 mL

## 2015-12-18 MED ORDER — HYDROMORPHONE HCL 1 MG/ML IJ SOLN
INTRAMUSCULAR | Status: AC
  Administered 2015-12-18: 0.5 mg via INTRAVENOUS
  Filled 2015-12-18: qty 1

## 2015-12-18 MED ORDER — PROPOFOL 10 MG/ML IV BOLUS
INTRAVENOUS | Status: DC | PRN
  Administered 2015-12-18: 200 mg via INTRAVENOUS

## 2015-12-18 MED ORDER — HYDROMORPHONE HCL 1 MG/ML IJ SOLN
0.2500 mg | INTRAMUSCULAR | Status: DC | PRN
  Administered 2015-12-18 (×2): 0.5 mg via INTRAVENOUS

## 2015-12-18 MED FILL — HYDROCODON-APAP 5-325: 5-325 | 4 days supply | Qty: 30 | Fill #0

## 2015-12-18 SURGICAL SUPPLY — 36 items
APPLIER CLIP 5 13 M/L LIGAMAX5 (MISCELLANEOUS)
APR CLP MED LRG 5 ANG JAW (MISCELLANEOUS)
CABLE HIGH FREQUENCY MONO STRZ (ELECTRODE) ×3 IMPLANT
CHLORAPREP W/TINT 26ML (MISCELLANEOUS) ×3 IMPLANT
CLIP APPLIE 5 13 M/L LIGAMAX5 (MISCELLANEOUS) IMPLANT
COVER SURGICAL LIGHT HANDLE (MISCELLANEOUS) IMPLANT
DECANTER SPIKE VIAL GLASS SM (MISCELLANEOUS) ×3 IMPLANT
DEVICE SECURE STRAP 25 ABSORB (INSTRUMENTS) IMPLANT
DISSECT BALLN SPACEMKR + OVL (BALLOONS) ×3
DISSECTOR BALLN SPACEMKR + OVL (BALLOONS) ×1 IMPLANT
DRAIN CHANNEL 19F RND (DRAIN) IMPLANT
DRAPE LAPAROSCOPIC ABDOMINAL (DRAPES) IMPLANT
ELECT REM PT RETURN 9FT ADLT (ELECTROSURGICAL) ×3
ELECTRODE REM PT RTRN 9FT ADLT (ELECTROSURGICAL) ×1 IMPLANT
EVACUATOR SILICONE 100CC (DRAIN) IMPLANT
GLOVE BIO SURGEON STRL SZ7 (GLOVE) ×12 IMPLANT
GLOVE BIOGEL PI IND STRL 7.0 (GLOVE) ×1 IMPLANT
GLOVE BIOGEL PI INDICATOR 7.0 (GLOVE) ×2
GLOVE SURG SS PI 7.0 STRL IVOR (GLOVE) ×3 IMPLANT
GOWN STRL REUS W/TWL LRG LVL3 (GOWN DISPOSABLE) ×3 IMPLANT
GOWN STRL REUS W/TWL XL LVL3 (GOWN DISPOSABLE) ×6 IMPLANT
IRRIG SUCT STRYKERFLOW 2 WTIP (MISCELLANEOUS) ×3
IRRIGATION SUCT STRKRFLW 2 WTP (MISCELLANEOUS) ×1 IMPLANT
KIT BASIN OR (CUSTOM PROCEDURE TRAY) ×3 IMPLANT
LIQUID BAND (GAUZE/BANDAGES/DRESSINGS) ×3 IMPLANT
MESH 3DMAX LIGHT 4.1X6.2 LT LR (Mesh General) ×3 IMPLANT
MESH 3DMAX LIGHT 4.1X6.2 RT LR (Mesh General) ×3 IMPLANT
SCISSORS LAP 5X35 DISP (ENDOMECHANICALS) ×3 IMPLANT
SUT ETHILON 2 0 PS N (SUTURE) IMPLANT
SUT MNCRL AB 4-0 PS2 18 (SUTURE) ×3 IMPLANT
SUT VICRYL 0 UR6 27IN ABS (SUTURE) IMPLANT
TOWEL OR 17X26 10 PK STRL BLUE (TOWEL DISPOSABLE) ×3 IMPLANT
TRAY FOLEY W/METER SILVER 16FR (SET/KITS/TRAYS/PACK) IMPLANT
TRAY LAPAROSCOPIC (CUSTOM PROCEDURE TRAY) ×3 IMPLANT
TROCAR CANNULA W/PORT DUAL 5MM (MISCELLANEOUS) ×3 IMPLANT
TUBING INSUF HEATED (TUBING) ×3 IMPLANT

## 2015-12-18 NOTE — Anesthesia Postprocedure Evaluation (Signed)
Anesthesia Post Note  Patient: Devin KlippelJohn P Burton  Procedure(s) Performed: Procedure(s) (LRB): LAPAROSCOPIC BILATERAL INGUINAL HERNIA REPAIR (N/A) INSERTION OF MESH (N/A)  Patient location during evaluation: PACU Anesthesia Type: General Level of consciousness: awake and alert Pain management: pain level controlled Vital Signs Assessment: post-procedure vital signs reviewed and stable Respiratory status: spontaneous breathing, nonlabored ventilation, respiratory function stable and patient connected to nasal cannula oxygen Cardiovascular status: blood pressure returned to baseline and stable Postop Assessment: no signs of nausea or vomiting Anesthetic complications: no    Last Vitals:  Vitals:   12/18/15 1115 12/18/15 1130  BP: 137/67 131/74  Pulse: (!) 57 (!) 57  Resp: 13 10  Temp:      Last Pain:  Vitals:   12/18/15 1130  TempSrc:   PainSc: 8                  Reino KentJudd, Bernardine Langworthy J

## 2015-12-18 NOTE — Progress Notes (Signed)
Patient had received tylenol 1000 mg PO at 0815 today. Unable to give hydrocodone with tylenol. Called Dr Sheliah HatchKinsinger and received order for oxycodone IR.

## 2015-12-18 NOTE — Anesthesia Preprocedure Evaluation (Signed)
Anesthesia Evaluation  Patient identified by MRN, date of birth, ID band Patient awake    Reviewed: Allergy & Precautions, H&P , NPO status , Patient's Chart, lab work & pertinent test results  History of Anesthesia Complications (+) PONV and history of anesthetic complications  Airway Mallampati: I  TM Distance: >3 FB Neck ROM: full    Dental no notable dental hx. (+) Teeth Intact, Dental Advisory Given   Pulmonary    Pulmonary exam normal breath sounds clear to auscultation       Cardiovascular negative cardio ROS   Rhythm:regular Rate:Normal     Neuro/Psych  Headaches, PSYCHIATRIC DISORDERS Depression    GI/Hepatic Neg liver ROS, GERD  Controlled and Medicated,  Endo/Other  negative endocrine ROS  Renal/GU negative Renal ROS  negative genitourinary   Musculoskeletal   Abdominal   Peds  Hematology negative hematology ROS (+)   Anesthesia Other Findings   Reproductive/Obstetrics negative OB ROS                             Anesthesia Physical  Anesthesia Plan  ASA: II  Anesthesia Plan: General   Post-op Pain Management:    Induction: Intravenous  Airway Management Planned: Oral ETT  Additional Equipment:   Intra-op Plan:   Post-operative Plan: Extubation in OR  Informed Consent: I have reviewed the patients History and Physical, chart, labs and discussed the procedure including the risks, benefits and alternatives for the proposed anesthesia with the patient or authorized representative who has indicated his/her understanding and acceptance.   Dental advisory given  Plan Discussed with: CRNA  Anesthesia Plan Comments:         Anesthesia Quick Evaluation

## 2015-12-18 NOTE — Op Note (Signed)
Preop diagnosis: bilateral inguinal hernia  Postop diagnosis: bilateral indirect inguinal hernia  Procedure: laparoscopic Bilateral inguinal hernia repair with mesh  Surgeon: Devin RossettiLuke Liz Pinho, M.D.  Asst: none  Anesthesia: Gen.   Indications for procedure: Devin Burton is a 30 y.o. male with symptoms of pain and enlarging Bilateral inguinal hernia(s). After discussing risks, alternatives and benefits he decided on laparoscopic repair and was brought to day surgery for repair.  Description of procedure: The patient was brought into the operative suite, placed supine. Anesthesia was administered with endotracheal tube. Patient was strapped in place. The patient was prepped and draped in the usual sterile fashion.  Next quarter percent Marcaine was injected to the Left of the umbilicus, and a transverse 2 cm incision was made. Dissection was used to visualize the anterior rectus sheath. Anterior rectus sheath was sharply incised, next to the 10 mm balloon dissecting trocar was inserted without resistance. Laparoscope was inserted the balloon was inflated under direct visualization.The indirect hernia on the right was visualized, the right side had a reduced hernia sac just inferior to the deep ring. Balloon portion of the trocar was removed laparoscope was inserted and pneumoperitoneum was applied. 0.25% Marcaine was used then used to anesthetize the pubic area, and one area 5 cm superior in the middle area. 1 cm incisions were made over each these areas and 5 mm trocar was inserted under direct visualization.  Next a began our dissection on the right side identifying the ASIS laterally and then working back medially removing the filmy tissue and adhesions of the peritoneum to the abdominal wall. Hernia sac was completely dissected out of the canal. Vas deference and contents of the cord were safely dissected away of the hernia sac.   Next a began our dissection on the left side identifying the ASIS  laterally and then working back medially removing the filmy tissue and adhesions of the peritoneum to the abdominal wall. Hernia sac was completely dissected out of the canal. Vas deference and contents of the cord were safely dissected away of the hernia sac.    A 3D max light weight mesh was inserted and tacked medially to the lacunar ligament on the left side. One additional tack was placed on the lateral anterior aspect. The mesh was positioned flat and directly up against the direct and indirect areas. A 3D max light weight mesh was inserted and tacked medially to the lacunar ligament on the right side. The CO2 was evacuated while watching to ensure the mesh did not migrate. The remainder of the Exparel mix was infused along the line of the inguinal ligament deep to the fascia. The anterior rectus fascia was closed with 0 vicryl in interrupted sutures and all skin incisions were closed with 4-0 monocryl subcu stitch. The patient awoke from anesthesia and was brought to PACU in stable condition.  Findings: bilateral indirect inguinal hernia  Specimen: none  Blood loss: 30 ml  Local anesthesia: 40 ml 1:1 Exparel:Saline  Complications: none  Implant: right and left large 3Dmax light mesh  Devin Burton, M.D. General, Bariatric, & Minimally Invasive Surgery [facility], [place] 10:55 AM 12/18/2015

## 2015-12-18 NOTE — Anesthesia Procedure Notes (Signed)
Procedure Name: Intubation Date/Time: 12/18/2015 9:42 AM Performed by: Minerva EndsMIRARCHI, Trevyon Swor M Pre-anesthesia Checklist: Patient identified, Emergency Drugs available, Suction available and Patient being monitored Patient Re-evaluated:Patient Re-evaluated prior to inductionOxygen Delivery Method: Circle System Utilized Preoxygenation: Pre-oxygenation with 100% oxygen Intubation Type: IV induction Ventilation: Mask ventilation without difficulty Laryngoscope Size: Miller and 2 Grade View: Grade I Tube type: Oral Tube size: 7.0 mm Number of attempts: 1 Airway Equipment and Method: Stylet Placement Confirmation: ETT inserted through vocal cords under direct vision,  positive ETCO2 and breath sounds checked- equal and bilateral Secured at: 22 cm Tube secured with: Tape Dental Injury: Teeth and Oropharynx as per pre-operative assessment  Comments: Smooth IV induction Judd--- good mask AW-- small nick to lower lip with mask---   Intubation AM CRNA atraumatic--- teeth and mouth as preop--- bilat BS Gentry RochJudd

## 2015-12-18 NOTE — Discharge Instructions (Signed)

## 2015-12-18 NOTE — H&P (Signed)
Devin Burton is an 30 y.o. male.   Chief Complaint: hernia HPI: 30 yo male with left groin pain and occasional bulge. He denies fevers or chills. He has no history of hernia repair. He denies weight loss  Past Medical History:  Diagnosis Date  . Abscess    left buttock  . ALLERGIC RHINITIS 05/28/2007  . Complication of anesthesia   . GERD (gastroesophageal reflux disease)   . Headache(784.0)   . Hemorrhoid   . Pilonidal cyst   . Pneumonia 3-4 years ago  . PONV (postoperative nausea and vomiting)     Past Surgical History:  Procedure Laterality Date  . FRACTURE SURGERY  2009   right wrist  . PILONIDAL CYST EXCISION  12/30/10  . PILONIDAL CYST EXCISION  04/08/2011   Procedure: CYST EXCISION PILONIDAL SIMPLE;  Surgeon: Wilmon Arms. Corliss Skains, MD;  Location: [facility] OR;  Service: General;  Laterality: N/A;  Excision of chronic pilonidal abscess  . TONSILLECTOMY      Family History  Problem Relation Age of Onset  . HIV Mother   . HIV Father     d. age 21 (HIV likely from blood transfusion given for bleeding assoc with MVA injuries)  . Colon cancer Neg Hx   . Prostate cancer Neg Hx    Social History:  reports that he has never smoked. His smokeless tobacco use includes Snuff. He reports that he drinks alcohol. He reports that he does not use drugs.  Allergies:  Allergies  Allergen Reactions  . Penicillins Anaphylaxis    Of throat Has patient had a PCN reaction causing immediate rash, facial/tongue/throat swelling, SOB or lightheadedness with hypotension: yes Has patient had a PCN reaction causing severe rash involving mucus membranes or skin necrosis: yes Has patient had a PCN reaction that required hospitalization yes Has patient had a PCN reaction occurring within the last 10 years: yes If all of the above answers are "NO", then may proceed with Cephalosporin use.  . Clarithromycin     REACTION: SOB, Palpitations  . Sulfamethoxazole-Trimethoprim     Patient unsure of reaction.  .  Levaquin [Levofloxacin] Other (See Comments)    dizzy    Medications Prior to Admission  Medication Sig Dispense Refill  . acetaminophen (TYLENOL) 500 MG tablet Take 500 mg by mouth every 6 (six) hours as needed.    . fluticasone (FLONASE) 50 MCG/ACT nasal spray Place into both nostrils daily.    Marland Kitchen omeprazole (PRILOSEC) 40 MG capsule Take 1 capsule (40 mg total) by mouth daily. 90 capsule 3  . OVER THE COUNTER MEDICATION Take 1 tablet by mouth daily as needed (allergy).      No results found for this or any previous visit (from the past 48 hour(s)). No results found.  Review of Systems  Constitutional: Negative for chills and fever.  HENT: Negative for hearing loss.   Eyes: Negative for blurred vision and double vision.  Respiratory: Negative for cough and hemoptysis.   Cardiovascular: Negative for chest pain and palpitations.  Gastrointestinal: Negative for abdominal pain, nausea and vomiting.  Genitourinary: Negative for dysuria and urgency.  Musculoskeletal: Negative for myalgias and neck pain.  Skin: Negative for itching and rash.  Neurological: Negative for dizziness, tingling and headaches.  Endo/Heme/Allergies: Does not bruise/bleed easily.  Psychiatric/Behavioral: Negative for depression and suicidal ideas.    Blood pressure 140/82, pulse (!) 50, temperature 97.8 F (36.6 C), temperature source Oral, resp. rate 16, height 6' (1.829 m), weight 98.3 kg (216 lb 12  oz), SpO2 100 %. Physical Exam  Nursing note and vitals reviewed. Constitutional: He is oriented to person, place, and time. He appears well-developed and well-nourished.  HENT:  Head: Normocephalic and atraumatic.  Eyes: Conjunctivae and EOM are normal. No scleral icterus.  Neck: Normal range of motion. Neck supple.  Cardiovascular: Normal rate and regular rhythm.   Respiratory: Effort normal and breath sounds normal. He has no wheezes. He has no rales. He exhibits no tenderness.  GI: Soft. He exhibits no  distension. There is no tenderness. There is no rebound.  Bilateral inguinal hernias  Musculoskeletal: Normal range of motion. He exhibits no edema.  Neurological: He is alert and oriented to person, place, and time.  Skin: Skin is warm and dry.  Psychiatric: He has a normal mood and affect. His behavior is normal.     Assessment/Plan 30 yo male with bilateral inguinal hernias. Left more symptomatic than right -lap bilateral hernia repair with mesh -plan outpatient procedure  Rodman PickleLuke Aaron Maude Hettich, MD 12/18/2015, 9:25 AM

## 2015-12-18 NOTE — Transfer of Care (Signed)
Immediate Anesthesia Transfer of Care Note  Patient: Devin KlippelJohn P Botelho  Procedure(s) Performed: Procedure(s): LAPAROSCOPIC BILATERAL INGUINAL HERNIA REPAIR (N/A) INSERTION OF MESH (N/A)  Patient Location: PACU  Anesthesia Type:General  Level of Consciousness: sedated  Airway & Oxygen Therapy: Patient Spontanous Breathing and Patient connected to face mask oxygen  Post-op Assessment: Report given to RN and Post -op Vital signs reviewed and stable  Post vital signs: Reviewed and stable  Last Vitals:  Vitals:   12/18/15 0744  BP: 140/82  Pulse: (!) 50  Resp: 16  Temp: 36.6 C    Last Pain:  Vitals:   12/18/15 0853  TempSrc:   PainSc: 3       Patients Stated Pain Goal: 4 (12/18/15 0853)  Complications: No apparent anesthesia complications

## 2016-01-06 ENCOUNTER — Telehealth: Payer: Self-pay | Admitting: General Surgery

## 2016-01-06 NOTE — Telephone Encounter (Signed)
Pt called stating worse abdominal pain after mowing the lawn yesterday.  Also having some diarrhea.  I recommended that he call the office in the AM to be seen if his symptoms did not resolve by then  Vanita PandaAlicia C Georgi Tuel, MD  Colorectal and General Surgery [facility]

## 2016-01-11 ENCOUNTER — Other Ambulatory Visit: Payer: Self-pay | Admitting: General Surgery

## 2016-01-11 DIAGNOSIS — K4020 Bilateral inguinal hernia, without obstruction or gangrene, not specified as recurrent: Secondary | ICD-10-CM

## 2016-01-17 ENCOUNTER — Ambulatory Visit
Admission: RE | Admit: 2016-01-17 | Discharge: 2016-01-17 | Disposition: A | Discharge status: Home / Self Care | Payer: BLUE CROSS/BLUE SHIELD | Source: Ambulatory Visit | Attending: General Surgery | Admitting: General Surgery

## 2016-01-17 DIAGNOSIS — K4020 Bilateral inguinal hernia, without obstruction or gangrene, not specified as recurrent: Secondary | ICD-10-CM

## 2016-01-17 MED ORDER — IOPAMIDOL (ISOVUE-300) INJECTION 61%
125.0000 mL | Freq: Once | INTRAVENOUS | Status: AC | PRN
  Administered 2016-01-17: 125 mL via INTRAVENOUS

## 2016-04-21 DIAGNOSIS — M109 Gout, unspecified: Secondary | ICD-10-CM

## 2016-04-21 HISTORY — DX: Gout, unspecified: M10.9

## 2016-07-01 ENCOUNTER — Encounter: Payer: Self-pay | Admitting: Family Medicine

## 2016-07-01 ENCOUNTER — Ambulatory Visit (INDEPENDENT_AMBULATORY_CARE_PROVIDER_SITE_OTHER): Payer: BC Managed Care – PPO | Admitting: Family Medicine

## 2016-07-01 VITALS — BP 130/84 | HR 85 | Temp 98.5°F | Resp 20 | Wt 225.2 lb

## 2016-07-01 DIAGNOSIS — M79675 Pain in left toe(s): Secondary | ICD-10-CM | POA: Diagnosis not present

## 2016-07-01 LAB — CBC WITH DIFFERENTIAL/PLATELET
BASOS ABS: 117 {cells}/uL (ref 0–200)
Basophils Relative: 1 %
EOS ABS: 468 {cells}/uL (ref 15–500)
Eosinophils Relative: 4 %
HEMATOCRIT: 44.6 % (ref 38.5–50.0)
Hemoglobin: 14.7 g/dL (ref 13.2–17.1)
Lymphocytes Relative: 31 %
Lymphs Abs: 3627 cells/uL (ref 850–3900)
MCH: 29 pg (ref 27.0–33.0)
MCHC: 33 g/dL (ref 32.0–36.0)
MCV: 88 fL (ref 80.0–100.0)
MONO ABS: 819 {cells}/uL (ref 200–950)
MPV: 11.3 fL (ref 7.5–12.5)
Monocytes Relative: 7 %
NEUTROS PCT: 57 %
Neutro Abs: 6669 cells/uL (ref 1500–7800)
Platelets: 273 10*3/uL (ref 140–400)
RBC: 5.07 MIL/uL (ref 4.20–5.80)
RDW: 13.6 % (ref 11.0–15.0)
WBC: 11.7 10*3/uL — AB (ref 3.8–10.8)

## 2016-07-01 MED ORDER — PREDNISONE 20 MG PO TABS: ORAL_TABLET | ORAL | 0 refills | Status: DC

## 2016-07-01 NOTE — Progress Notes (Signed)
Devin Burton , 1985-07-12, 31 y.o., male MRN: [id number] Patient Care Team    Relationship Specialty Notifications Start End  Jeoffrey Massed, MD PCP - General Family Medicine  07/24/14     CC: left great toe pain Subjective: Pt presents for an OV with complaints of left great toe pain of 4 days duration.  Associated symptoms include redness, swelling and pain. He states about 1.5 months ago he experienced bilateral  Large toe pain, without swelling or redness, that self resolved in about 10 days. He reports current epsiode is much worse. The pain is constant pain that started on Wednesday and by Saturday he reports he was unable to walk. The sheets on the bed hurt to touch his toe. He denies injury, fever or chills. He went to the pharmacy and they told him to take a pill, which he can not think of the name. He has never had a h/o gout, but states he does have it in his family.    No flowsheet data found.  Allergies  Allergen Reactions  . Penicillins Anaphylaxis    Of throat Has patient had a PCN reaction causing immediate rash, facial/tongue/throat swelling, SOB or lightheadedness with hypotension: yes Has patient had a PCN reaction causing severe rash involving mucus membranes or skin necrosis: yes Has patient had a PCN reaction that required hospitalization yes Has patient had a PCN reaction occurring within the last 10 years: yes If all of the above answers are "NO", then may proceed with Cephalosporin use.  . Clarithromycin     REACTION: SOB, Palpitations  . Sulfamethoxazole-Trimethoprim     Patient unsure of reaction.  Barbera Setters [Levofloxacin] Other (See Comments)    dizzy   Social History  Substance Use Topics  . Smoking status: Never Smoker  . Smokeless tobacco: Current User    Types: Snuff  . Alcohol use Yes     Comment: 3 daily    Past Medical History:  Diagnosis Date  . Abscess    left buttock  . ALLERGIC RHINITIS 05/28/2007  . Complication of anesthesia   .  GERD (gastroesophageal reflux disease)   . Headache(784.0)   . Hemorrhoid   . Pilonidal cyst   . Pneumonia 3-4 years ago  . PONV (postoperative nausea and vomiting)    Past Surgical History:  Procedure Laterality Date  . FRACTURE SURGERY  2009   right wrist  . INGUINAL HERNIA REPAIR N/A 12/18/2015   Procedure: LAPAROSCOPIC BILATERAL INGUINAL HERNIA REPAIR;  Surgeon: De Blanch Kinsinger, MD;  Location: [facility] ORS;  Service: General;  Laterality: N/A;  . INSERTION OF MESH N/A 12/18/2015   Procedure: INSERTION OF MESH;  Surgeon: De Blanch Kinsinger, MD;  Location: [facility] ORS;  Service: General;  Laterality: N/A;  . PILONIDAL CYST EXCISION  12/30/10  . PILONIDAL CYST EXCISION  04/08/2011   Procedure: CYST EXCISION PILONIDAL SIMPLE;  Surgeon: Wilmon Arms. Corliss Skains, MD;  Location: [facility] OR;  Service: General;  Laterality: N/A;  Excision of chronic pilonidal abscess  . TONSILLECTOMY     Family History  Problem Relation Age of Onset  . HIV Mother   . HIV Father     d. age 59 (HIV likely from blood transfusion given for bleeding assoc with MVA injuries)  . Colon cancer Neg Hx   . Prostate cancer Neg Hx    Allergies as of 07/01/2016      Reactions   Penicillins Anaphylaxis   Of throat Has patient had a PCN reaction  causing immediate rash, facial/tongue/throat swelling, SOB or lightheadedness with hypotension: yes Has patient had a PCN reaction causing severe rash involving mucus membranes or skin necrosis: yes Has patient had a PCN reaction that required hospitalization yes Has patient had a PCN reaction occurring within the last 10 years: yes If all of the above answers are "NO", then may proceed with Cephalosporin use.   Clarithromycin    REACTION: SOB, Palpitations   Sulfamethoxazole-trimethoprim    Patient unsure of reaction.   Levaquin [levofloxacin] Other (See Comments)   dizzy      Medication List       Accurate as of 07/01/16  3:43 PM. Always use your most recent med list.            acetaminophen 500 MG tablet Commonly known as:  TYLENOL Take 500 mg by mouth every 6 (six) hours as needed.   fluticasone 50 MCG/ACT nasal spray Commonly known as:  FLONASE Place into both nostrils daily.   ibuprofen 800 MG tablet Commonly known as:  ADVIL,MOTRIN Take 1 tablet (800 mg total) by mouth every 8 (eight) hours as needed.   omeprazole 40 MG capsule Commonly known as:  PRILOSEC Take 1 capsule (40 mg total) by mouth daily.   OVER THE COUNTER MEDICATION Take 1 tablet by mouth daily as needed (allergy).       No results found for this or any previous visit (from the past 24 hour(s)). No results found.   ROS: Negative, with the exception of above mentioned in HPI   Objective:  BP 130/84 (BP Location: Right Arm, Patient Position: Sitting, Cuff Size: Large)   Pulse 85   Temp 98.5 F (36.9 C)   Resp 20   Wt 225 lb 4 oz (102.2 kg)   SpO2 97%   BMI 30.55 kg/m  Body mass index is 30.55 kg/m. Gen: Afebrile. No acute distress. Nontoxic in appearance, well developed, well nourished.  HENT: AT. Devin Burton. MMM Eyes:Pupils Equal Round Reactive to light, Extraocular movements intact,  Conjunctiva without redness, discharge or icterus. Skin: mild redness, no rashes, purpura or petechiae.  Neuro/msk: mild limp. Mild swelling, TTP bottom of foot over 1st metatarsal. NV intact distally.   Assessment/Plan: Devin Burton is a 31 y.o. male present for OV for  Great toe pain, left - exam consistent with potential gout. Pain located more on the bottom of foot over 1st metatarsal.  - predniSONE (DELTASONE) 20 MG tablet; 60 mg x3d, 40 mg x3d, 20 mg x2d, 10 mg x2d  Dispense: 18 tablet; Refill: 0 - Uric acid - CBC w/Diff - AVS on gout and low purine diet.  Reviewed expectations re: course of current medical issues.  Discussed self-management of symptoms.  Outlined signs and symptoms indicating need for more acute intervention.  Patient verbalized understanding and all questions  were answered.  Patient received an After-Visit Summary.   electronically signed by:  Felix Pacini, DO  [facility] - OR

## 2016-07-01 NOTE — Patient Instructions (Signed)
Gout Gout is painful swelling that can happen in some of your joints. Gout is a type of arthritis. This condition is caused by having too much uric acid in your body. Uric acid is a chemical that is made when your body breaks down substances called purines. If your body has too much uric acid, sharp crystals can form and build up in your joints. This causes pain and swelling. Gout attacks can happen quickly and be very painful (acute gout). Over time, the attacks can affect more joints and happen more often (chronic gout). Follow these instructions at home: During a Gout Attack   If directed, put ice on the painful area:  Put ice in a plastic bag.  Place a towel between your skin and the bag.  Leave the ice on for 20 minutes, 2-3 times a day.  Rest the joint as much as possible. If the joint is in your leg, you may be given crutches to use.  Raise (elevate) the painful joint above the level of your heart as often as you can.  Drink enough fluids to keep your pee (urine) clear or pale yellow.  Take over-the-counter and prescription medicines only as told by your doctor.  Do not drive or use heavy machinery while taking prescription pain medicine.  Follow instructions from your doctor about what you can or cannot eat and drink.  Return to your normal activities as told by your doctor. Ask your doctor what activities are safe for you. Avoiding Future Gout Attacks   Follow a low-purine diet as told by a specialist (dietitian) or your doctor. Avoid foods and drinks that have a lot of purines, such as:  Liver.  Kidney.  Anchovies.  Asparagus.  Herring.  Mushrooms  Mussels.  Beer.  Limit alcohol intake to no more than 1 drink a day for nonpregnant women and 2 drinks a day for men. One drink equals 12 oz of beer, 5 oz of wine, or 1 oz of hard liquor.  Stay at a healthy weight or lose weight if you are overweight. If you want to lose weight, talk with your doctor. It is  important that you do not lose weight too fast.  Start or continue an exercise plan as told by your doctor.  Drink enough fluids to keep your pee clear or pale yellow.  Take over-the-counter and prescription medicines only as told by your doctor.  Keep all follow-up visits as told by your doctor. This is important. Contact a doctor if:  You have another gout attack.  You still have symptoms of a gout attack after10 days of treatment.  You have problems (side effects) because of your medicines.  You have chills or a fever.  You have burning pain when you pee (urinate).  You have pain in your lower back or belly. Get help right away if:  You have very bad pain.  Your pain cannot be controlled.  You cannot pee. This information is not intended to replace advice given to you by your health care provider. Make sure you discuss any questions you have with your health care provider. Document Released: 01/15/2008 Document Revised: 09/13/2015 Document Reviewed: 01/18/2015 Elsevier Interactive Patient Education  2017 Elsevier Inc. Low-Purine Diet Purines are compounds that affect the level of uric acid in your body. A low-purine diet is a diet that is low in purines. Eating a low-purine diet can prevent the level of uric acid in your body from getting too high and causing gout or   kidney stones or both. What do I need to know about this diet?  Choose low-purine foods. Examples of low-purine foods are listed in the next section.  Drink plenty of fluids, especially water. Fluids can help remove uric acid from your body. Try to drink 8-16 cups (1.9-3.8 L) a day.  Limit foods high in fat, especially saturated fat, as fat makes it harder for the body to get rid of uric acid. Foods high in saturated fat include pizza, cheese, ice cream, whole milk, fried foods, and gravies. Choose foods that are lower in fat and lean sources of protein. Use olive oil when cooking as it contains healthy fats  that are not high in saturated fat.  Limit alcohol. Alcohol interferes with the elimination of uric acid from your body. If you are having a gout attack, avoid all alcohol.  Keep in mind that different people's bodies react differently to different foods. You will probably learn over time which foods do or do not affect you. If you discover that a food tends to cause your gout to flare up, avoid eating that food. You can more freely enjoy foods that do not cause problems. If you have any questions about a food item, talk to your dietitian or health care provider. Which foods are low, moderate, and high in purines? The following is a list of foods that are low, moderate, and high in purines. You can eat any amount of the foods that are low in purines. You may be able to have small amounts of foods that are moderate in purines. Ask your health care provider how much of a food moderate in purines you can have. Avoid foods high in purines. Grains   Foods low in purines: Enriched white bread, pasta, rice, cake, cornbread, popcorn.  Foods moderate in purines: Whole-grain breads and cereals, wheat germ, bran, oatmeal. Uncooked oatmeal. Dry wheat bran or wheat germ.  Foods high in purines: Pancakes, French toast, biscuits, muffins. Vegetables   Foods low in purines: All vegetables, except those that are moderate in purines.  Foods moderate in purines: Asparagus, cauliflower, spinach, mushrooms, green peas. Fruits   All fruits are low in purines. Meats and other Protein Foods   Foods low in purines: Eggs, nuts, peanut butter.  Foods moderate in purines: 80-90% lean beef, lamb, veal, pork, poultry, fish, eggs, peanut butter, nuts. Crab, lobster, oysters, and shrimp. Cooked dried beans, peas, and lentils.  Foods high in purines: Anchovies, sardines, herring, mussels, tuna, codfish, scallops, trout, and haddock. Bacon. Organ meats (such as liver or kidney). Tripe. Game meat. Goose.  Sweetbreads. Dairy   All dairy foods are low in purines. Low-fat and fat-free dairy products are best because they are low in saturated fat. Beverages   Drinks low in purines: Water, carbonated beverages, tea, coffee, cocoa.  Drinks moderate in purines: Soft drinks and other drinks sweetened with high-fructose corn syrup. Juices. To find whether a food or drink is sweetened with high-fructose corn syrup, look at the ingredients list.  Drinks high in purines: Alcoholic beverages (such as beer). Condiments   Foods low in purines: Salt, herbs, olives, pickles, relishes, vinegar.  Foods moderate in purines: Butter, margarine, oils, mayonnaise. Fats and Oils   Foods low in purines: All types, except gravies and sauces made with meat.  Foods high in purines: Gravies and sauces made with meat. Other Foods   Foods low in purines: Sugars, sweets, gelatin. Cake. Soups made without meat.  Foods moderate in purines: Meat-based or   fish-based soups, broths, or bouillons. Foods and drinks sweetened with high-fructose corn syrup.  Foods high in purines: High-fat desserts (such as ice cream, cookies, cakes, pies, doughnuts, and chocolate). Contact your dietitian for more information on foods that are not listed here.  This information is not intended to replace advice given to you by your health care provider. Make sure you discuss any questions you have with your health care provider. Document Released: 08/02/2010 Document Revised: 09/13/2015 Document Reviewed: 03/14/2013 Elsevier Interactive Patient Education  2017 Elsevier Inc.  

## 2016-07-02 ENCOUNTER — Telehealth: Payer: Self-pay | Admitting: Family Medicine

## 2016-07-02 DIAGNOSIS — M109 Gout, unspecified: Secondary | ICD-10-CM

## 2016-07-02 LAB — URIC ACID: Uric Acid, Serum: 8.4 mg/dL — ABNORMAL HIGH (ref 4.0–8.0)

## 2016-07-02 NOTE — Telephone Encounter (Signed)
Spoke with patient reviewed lab results and instructions patient verbalized understanding. 

## 2016-07-02 NOTE — Telephone Encounter (Signed)
Please call pt: - his uric acid is slightly elevated, suggesting his discomfort is gout. Complete the steroid taper and try to monitor diet as suggested.  - Since this is his first diagnostic flare, if he finds he has frequent flares there are daily medications to prevent flares. If flares are infrequent he can try naproxen BID  at first onset of discomfort for a few days and sometimes if started early enough that can stop it from progressing to needing an appt.

## 2016-07-03 ENCOUNTER — Telehealth: Payer: Self-pay | Admitting: Family Medicine

## 2016-07-03 MED ORDER — HYDROCODONE-ACETAMINOPHEN 5-325 MG PO TABS: 1.0000 | ORAL_TABLET | Freq: Four times a day (QID) | ORAL | 0 refills | Status: DC | PRN

## 2016-07-03 MED ORDER — ALLOPURINOL 100 MG PO TABS: 100.0000 mg | ORAL_TABLET | Freq: Every day | ORAL | 1 refills | Status: DC

## 2016-07-03 NOTE — Telephone Encounter (Signed)
Pls eRx allopurinol 100 mg, 1 tab po qd, #30, RF x 1. When he finishes his prednisone, he needs to take 2 OTC aleve tabs with food twice a day for the next 10 days. Return for o/v with me in [id number].-thx

## 2016-07-03 NOTE — Telephone Encounter (Signed)
Patient is requesting something different for his gout pain.  Thank you,  -LL

## 2016-07-03 NOTE — Telephone Encounter (Signed)
Pt advised and voiced understanding.    Pt wanted to know if there was a gout medication he could have on hand for when this happens or something he can take on a regular basis to stop the flare ups. He stated that this is his second flare up. Please advise. Thanks.

## 2016-07-03 NOTE — Telephone Encounter (Signed)
Vicodin rx printed. 

## 2016-07-03 NOTE — Telephone Encounter (Signed)
Pt advised and voiced understanding.   

## 2016-08-21 NOTE — Telephone Encounter (Signed)
After almost 2 months pt has not pick up Rx for Vicodin. Rx was voided and put in shred box.

## 2016-09-01 ENCOUNTER — Encounter: Payer: Self-pay | Admitting: *Deleted

## 2016-09-01 ENCOUNTER — Other Ambulatory Visit: Payer: Self-pay | Admitting: *Deleted

## 2016-09-01 MED ORDER — ALLOPURINOL 100 MG PO TABS: 100.0000 mg | ORAL_TABLET | Freq: Every day | ORAL | 0 refills | Status: DC

## 2016-09-01 NOTE — Telephone Encounter (Signed)
My Chart message sent

## 2016-09-01 NOTE — Telephone Encounter (Signed)
[facility]   RF request for allopurinol LOV: 07/01/16 Next ov: None Last written: 07/03/16 #30 w/ 1RF  Please advise. Thanks.

## 2016-09-01 NOTE — Telephone Encounter (Signed)
I'll give 30 d supply with no RF. Pt overdue for f/u gout with me.-thx

## 2016-09-09 NOTE — Telephone Encounter (Signed)
Left message for pt to call back  °

## 2016-09-18 NOTE — Telephone Encounter (Signed)
Noted.  Will remove med from his med list.

## 2016-09-18 NOTE — Addendum Note (Signed)
Addended by: Smitty KnudsenSUTHERLAND, [place] Rowe K on: 09/18/2016 08:57 AM   Modules accepted: Orders

## 2016-09-18 NOTE — Telephone Encounter (Signed)
Pt advised. Pt stated that he has stopped taking this medication. Medication has been removed from medication list.

## 2017-10-26 ENCOUNTER — Encounter: Payer: Self-pay | Admitting: Family Medicine

## 2017-10-26 ENCOUNTER — Ambulatory Visit: Payer: BC Managed Care – PPO | Admitting: Family Medicine

## 2017-10-26 VITALS — BP 128/80 | HR 76 | Temp 98.6°F | Resp 20 | Ht 72.0 in | Wt 229.2 lb

## 2017-10-26 DIAGNOSIS — M109 Gout, unspecified: Secondary | ICD-10-CM | POA: Diagnosis not present

## 2017-10-26 DIAGNOSIS — R42 Dizziness and giddiness: Secondary | ICD-10-CM

## 2017-10-26 DIAGNOSIS — R300 Dysuria: Secondary | ICD-10-CM | POA: Diagnosis not present

## 2017-10-26 DIAGNOSIS — M79672 Pain in left foot: Secondary | ICD-10-CM

## 2017-10-26 LAB — POC URINALSYSI DIPSTICK (AUTOMATED)
Bilirubin, UA: NEGATIVE
Blood, UA: NEGATIVE
GLUCOSE UA: NEGATIVE
KETONES UA: NEGATIVE
Leukocytes, UA: NEGATIVE
Nitrite, UA: NEGATIVE
Protein, UA: NEGATIVE
SPEC GRAV UA: 1.015 (ref 1.010–1.025)
Urobilinogen, UA: 0.2 E.U./dL
pH, UA: 7.5 (ref 5.0–8.0)

## 2017-10-26 MED ORDER — PREDNISONE 20 MG PO TABS: ORAL_TABLET | ORAL | 0 refills | Status: DC

## 2017-10-26 NOTE — Patient Instructions (Signed)
Start prednisone taper today.  Collection of labs today to decide if your symptoms of dizziness is infection related or electrolytes etc,   HYDRATE! Greater than 100 ounces a day.  Follow with your PCP in 10 days on gout to discuss other options for control.   Follow LOW purine diet   Low-Purine Diet Purines are compounds that affect the level of uric acid in your body. A low-purine diet is a diet that is low in purines. Eating a low-purine diet can prevent the level of uric acid in your body from getting too high and causing gout or kidney stones or both. What do I need to know about this diet?  Choose low-purine foods. Examples of low-purine foods are listed in the next section.  Drink plenty of fluids, especially water. Fluids can help remove uric acid from your body. Try to drink 8-16 cups (1.9-3.8 L) a day.  Limit foods high in fat, especially saturated fat, as fat makes it harder for the body to get rid of uric acid. Foods high in saturated fat include pizza, cheese, ice cream, whole milk, fried foods, and gravies. Choose foods that are lower in fat and lean sources of protein. Use olive oil when cooking as it contains healthy fats that are not high in saturated fat.  Limit alcohol. Alcohol interferes with the elimination of uric acid from your body. If you are having a gout attack, avoid all alcohol.  Keep in mind that different people's bodies react differently to different foods. You will probably learn over time which foods do or do not affect you. If you discover that a food tends to cause your gout to flare up, avoid eating that food. You can more freely enjoy foods that do not cause problems. If you have any questions about a food item, talk to your dietitian or health care provider. Which foods are low, moderate, and high in purines? The following is a list of foods that are low, moderate, and high in purines. You can eat any amount of the foods that are low in purines. You may  be able to have small amounts of foods that are moderate in purines. Ask your health care provider how much of a food moderate in purines you can have. Avoid foods high in purines. Grains  Foods low in purines: Enriched white bread, pasta, rice, cake, cornbread, popcorn.  Foods moderate in purines: Whole-grain breads and cereals, wheat germ, bran, oatmeal. Uncooked oatmeal. Dry wheat bran or wheat germ.  Foods high in purines: Pancakes, [place] toast, biscuits, muffins. Vegetables  Foods low in purines: All vegetables, except those that are moderate in purines.  Foods moderate in purines: Asparagus, cauliflower, spinach, mushrooms, green peas. Fruits  All fruits are low in purines. Meats and other Protein Foods  Foods low in purines: Eggs, nuts, peanut butter.  Foods moderate in purines: 80-90% lean beef, lamb, veal, pork, poultry, fish, eggs, peanut butter, nuts. Crab, lobster, oysters, and shrimp. Cooked dried beans, peas, and lentils.  Foods high in purines: Anchovies, sardines, herring, mussels, tuna, codfish, scallops, trout, and haddock. Tomasa BlaseBacon. Organ meats (such as liver or kidney). Tripe. Game meat. Goose. Sweetbreads. Dairy  All dairy foods are low in purines. Low-fat and fat-free dairy products are best because they are low in saturated fat. Beverages  Drinks low in purines: Water, carbonated beverages, tea, coffee, cocoa.  Drinks moderate in purines: Soft drinks and other drinks sweetened with high-fructose corn syrup. Juices. To find whether a food or  drink is sweetened with high-fructose corn syrup, look at the ingredients list.  Drinks high in purines: Alcoholic beverages (such as beer). Condiments  Foods low in purines: Salt, herbs, olives, pickles, relishes, vinegar.  Foods moderate in purines: Butter, margarine, oils, mayonnaise. Fats and Oils  Foods low in purines: All types, except gravies and sauces made with meat.  Foods high in purines: Gravies and sauces  made with meat. Other Foods  Foods low in purines: Sugars, sweets, gelatin. Cake. Soups made without meat.  Foods moderate in purines: Meat-based or fish-based soups, broths, or bouillons. Foods and drinks sweetened with high-fructose corn syrup.  Foods high in purines: High-fat desserts (such as ice cream, cookies, cakes, pies, doughnuts, and chocolate). Contact your dietitian for more information on foods that are not listed here. This information is not intended to replace advice given to you by your health care provider. Make sure you discuss any questions you have with your health care provider. Document Released: 08/02/2010 Document Revised: 09/13/2015 Document Reviewed: 03/14/2013 Elsevier Interactive Patient Education  2017 [employer].

## 2017-10-26 NOTE — Progress Notes (Signed)
Devin Burton , January 28, 1986, 32 y.o., male MRN: [id number] Patient Care Team    Relationship Specialty Notifications Start End  McGowen, Adrian Blackwater, MD PCP - General Family Medicine  07/24/14     Chief Complaint  Patient presents with  . Ankle Pain    left- started Saturday    Subjective: Pt presents for an OV with complaints of ankle pain that started about 1 week ago. He reports it getting progressively more tender and difficult to walk. He denies injury, fever or chills. He has a h/o gout which usually affects his large toes. He also complains of vague dizziness, that he reports is only intermittently and dysuria that started yesterday with dark strong smelling urine.  Of note, he states he has had multiple gout attacks and is interested in trying a daily med for control of his gout. He reports someone tried him on one of them in the past, but he thinks it made him nauseated,so he stopped taking it. No flowsheet data found.  Allergies  Allergen Reactions  . Penicillins Anaphylaxis    Of throat Has patient had a PCN reaction causing immediate rash, facial/tongue/throat swelling, SOB or lightheadedness with hypotension: yes Has patient had a PCN reaction causing severe rash involving mucus membranes or skin necrosis: yes Has patient had a PCN reaction that required hospitalization yes Has patient had a PCN reaction occurring within the last 10 years: yes If all of the above answers are "NO", then may proceed with Cephalosporin use.  . Clarithromycin     REACTION: SOB, Palpitations  . Sulfamethoxazole-Trimethoprim     Patient unsure of reaction.  Mack Hook [Levofloxacin] Other (See Comments)    dizzy   Social History   Tobacco Use  . Smoking status: Never Smoker  . Smokeless tobacco: Current User    Types: Snuff  Substance Use Topics  . Alcohol use: Yes    Comment: 3 daily    Past Medical History:  Diagnosis Date  . Abscess    left buttock  . ALLERGIC RHINITIS 05/28/2007   . Complication of anesthesia   . GERD (gastroesophageal reflux disease)   . Headache(784.0)   . Hemorrhoid   . Pilonidal cyst   . Pneumonia 3-4 years ago  . PONV (postoperative nausea and vomiting)    Past Surgical History:  Procedure Laterality Date  . FRACTURE SURGERY  2009   right wrist  . INGUINAL HERNIA REPAIR N/A 12/18/2015   Procedure: LAPAROSCOPIC BILATERAL INGUINAL HERNIA REPAIR;  Surgeon: Arta Bruce Kinsinger, MD;  Location: [facility];  Service: General;  Laterality: N/A;  . INSERTION OF MESH N/A 12/18/2015   Procedure: INSERTION OF MESH;  Surgeon: Arta Bruce Kinsinger, MD;  Location: [facility];  Service: General;  Laterality: N/A;  . PILONIDAL CYST EXCISION  12/30/10  . PILONIDAL CYST EXCISION  04/08/2011   Procedure: CYST EXCISION PILONIDAL SIMPLE;  Surgeon: Imogene Burn. Georgette Dover, MD;  Location: [place] [facility];  Service: General;  Laterality: N/A;  Excision of chronic pilonidal abscess  . TONSILLECTOMY     Family History  Problem Relation Age of Onset  . HIV Mother   . HIV Father        d. age 8 (HIV likely from blood transfusion given for bleeding assoc with MVA injuries)  . Colon cancer Neg Hx   . Prostate cancer Neg Hx    Allergies as of 10/26/2017      Reactions   Penicillins Anaphylaxis   Of throat Has patient had  a PCN reaction causing immediate rash, facial/tongue/throat swelling, SOB or lightheadedness with hypotension: yes Has patient had a PCN reaction causing severe rash involving mucus membranes or skin necrosis: yes Has patient had a PCN reaction that required hospitalization yes Has patient had a PCN reaction occurring within the last 10 years: yes If all of the above answers are "NO", then may proceed with Cephalosporin use.   Clarithromycin    REACTION: SOB, Palpitations   Sulfamethoxazole-trimethoprim    Patient unsure of reaction.   Levaquin [levofloxacin] Other (See Comments)   dizzy      Medication List        Accurate as of 10/26/17  2:11 PM. Always use your  most recent med list.          fluticasone 50 MCG/ACT nasal spray Commonly known as:  FLONASE Place into both nostrils daily.   omeprazole 40 MG capsule Commonly known as:  PRILOSEC Take 1 capsule (40 mg total) by mouth daily.   OVER THE COUNTER MEDICATION Take 1 tablet by mouth daily as needed (allergy).       No results found for this or any previous visit (from the past 24 hour(s)). No results found.   ROS: Negative, with the exception of above mentioned in HPI   Objective:  BP (!) 142/85 (BP Location: Right Arm, Patient Position: Sitting, Cuff Size: Large)   Pulse 76   Temp 98.6 F (37 C)   Resp 20   Ht 6' (1.829 m)   Wt 229 lb 4 oz (104 kg)   SpO2 98%   BMI 31.09 kg/m  Body mass index is 31.09 kg/m. Gen: Afebrile. No acute distress. Nontoxic in appearance, well developed, well nourished.  HENT: AT. [place]. MMM Eyes:Pupils Equal Round Reactive to light, Extraocular movements intact,  Conjunctiva without redness, discharge or icterus. Skin: mild redness, no rashes, purpura or petechiae.  Neuro/msk: mild limp. Mild swelling, TTP bottom of foot over 1st metatarsal. NV intact distally.   Recent Results (from the past 2160 hour(s))  POCT Urinalysis Dipstick (Automated)     Status: Normal   Collection Time: 10/26/17  2:39 PM  Result Value Ref Range   Color, UA yellow    Clarity, UA clear    Glucose, UA Negative Negative   Bilirubin, UA Negative    Ketones, UA Negative    Spec Grav, UA 1.015 1.010 - 1.025   Blood, UA Negative    pH, UA 7.5 5.0 - 8.0   Protein, UA Negative Negative   Urobilinogen, UA 0.2 0.2 or 1.0 E.U./dL   Nitrite, UA Negative    Leukocytes, UA Negative Negative  CBC w/Diff     Status: Abnormal   Collection Time: 10/26/17  2:44 PM  Result Value Ref Range   WBC 9.8 4.0 - 10.5 K/uL   RBC 4.97 4.22 - 5.81 Mil/uL   Hemoglobin 15.1 13.0 - 17.0 g/dL   HCT 44.7 39.0 - 52.0 %   MCV 90.0 78.0 - 100.0 fl   MCHC 33.8 30.0 - 36.0 g/dL   RDW 12.9  11.5 - 15.5 %   Platelets 231.0 150.0 - 400.0 K/uL   Neutrophils Relative % 58.3 43.0 - 77.0 %   Lymphocytes Relative 26.9 12.0 - 46.0 %   Monocytes Relative 6.8 3.0 - 12.0 %   Eosinophils Relative 7.4 (H) 0.0 - 5.0 %   Basophils Relative 0.6 0.0 - 3.0 %   Neutro Abs 5.7 1.4 - 7.7 K/uL   Lymphs Abs 2.6  0.7 - 4.0 K/uL   Monocytes Absolute 0.7 0.1 - 1.0 K/uL   Eosinophils Absolute 0.7 0.0 - 0.7 K/uL   Basophils Absolute 0.1 0.0 - 0.1 K/uL  Urine Culture     Status: None   Collection Time: 10/26/17  2:44 PM  Result Value Ref Range   MICRO NUMBER: [id number]    SPECIMEN QUALITY: ADEQUATE    Sample Source URINE    STATUS: FINAL    Result: No Growth   Uric acid     Status: Abnormal   Collection Time: 10/26/17  2:44 PM  Result Value Ref Range   Uric Acid, Serum 8.4 (H) 4.0 - 7.8 mg/dL  Comp Met (CMET)     Status: None   Collection Time: 10/26/17  2:44 PM  Result Value Ref Range   Sodium 138 135 - 145 mEq/L   Potassium 4.6 3.5 - 5.1 mEq/L   Chloride 102 96 - 112 mEq/L   CO2 30 19 - 32 mEq/L   Glucose, Bld 97 70 - 99 mg/dL   BUN 10 6 - 23 mg/dL   Creatinine, Ser 1.10 0.40 - 1.50 mg/dL   Total Bilirubin 0.4 0.2 - 1.2 mg/dL   Alkaline Phosphatase 55 39 - 117 U/L   AST 17 0 - 37 U/L   ALT 24 0 - 53 U/L   Total Protein 7.1 6.0 - 8.3 g/dL   Albumin 4.4 3.5 - 5.2 g/dL   Calcium 9.3 8.4 - 10.5 mg/dL   GFR 83.51 >60.00 mL/min     Assessment/Plan: ELGIN CARN is a 32 y.o. male present for OV for  Dysuria/dizziness - vague symptoms/history. Urine appeared normal on POCT. Will send for culture to be certain.  - pt encouraged to increase water consumption. Dehydration, even if mild can cause symptoms.  - POCT Urinalysis Dipstick (Automated) - CBC w/Diff--> consider anemia. Denies bleeding.  - Urine Culture - Comp Met (CMET): r/o electrolytes/kidney cause.  - f/u PCP 1 -2 weeks.  Left foot pain/Acute gout of ankle, unspecified cause, unspecified laterality - mild erythema left  medial ankle. Mild TTP and movement. No injury, do not feel it necessary to image a this time.  - treat as gout given history - uric acid - predniSONE (DELTASONE) 20 MG tablet; 60 mg x3d, 40 mg x3d, 20 mg x2d, 10 mg x2d  Dispense: 18 tablet; Refill: 0 - Uric acid - low purine diet.  - will discuss daily med with PCP on follow up - F/U PCP 1-2 weeks    Reviewed expectations re: course of current medical issues.  Discussed self-management of symptoms.  Outlined signs and symptoms indicating need for more acute intervention.  Patient verbalized understanding and all questions were answered.  Patient received an After-Visit Summary.   > 25 minutes spent with patient, >50% of time spent face to face counseling and coordinating care.    electronically signed by:  Howard Pouch, DO  [place]

## 2017-10-27 ENCOUNTER — Telehealth: Payer: Self-pay | Admitting: Family Medicine

## 2017-10-27 LAB — CBC WITH DIFFERENTIAL/PLATELET
BASOS ABS: 0.1 10*3/uL (ref 0.0–0.1)
Basophils Relative: 0.6 % (ref 0.0–3.0)
EOS ABS: 0.7 10*3/uL (ref 0.0–0.7)
Eosinophils Relative: 7.4 % — ABNORMAL HIGH (ref 0.0–5.0)
HCT: 44.7 % (ref 39.0–52.0)
HEMOGLOBIN: 15.1 g/dL (ref 13.0–17.0)
Lymphocytes Relative: 26.9 % (ref 12.0–46.0)
Lymphs Abs: 2.6 10*3/uL (ref 0.7–4.0)
MCHC: 33.8 g/dL (ref 30.0–36.0)
MCV: 90 fl (ref 78.0–100.0)
MONO ABS: 0.7 10*3/uL (ref 0.1–1.0)
Monocytes Relative: 6.8 % (ref 3.0–12.0)
Neutro Abs: 5.7 10*3/uL (ref 1.4–7.7)
Neutrophils Relative %: 58.3 % (ref 43.0–77.0)
Platelets: 231 10*3/uL (ref 150.0–400.0)
RBC: 4.97 Mil/uL (ref 4.22–5.81)
RDW: 12.9 % (ref 11.5–15.5)
WBC: 9.8 10*3/uL (ref 4.0–10.5)

## 2017-10-27 LAB — URINE CULTURE
MICRO NUMBER:: 90806946
RESULT: NO GROWTH
SPECIMEN QUALITY: ADEQUATE

## 2017-10-27 LAB — COMPREHENSIVE METABOLIC PANEL
ALK PHOS: 55 U/L (ref 39–117)
ALT: 24 U/L (ref 0–53)
AST: 17 U/L (ref 0–37)
Albumin: 4.4 g/dL (ref 3.5–5.2)
BILIRUBIN TOTAL: 0.4 mg/dL (ref 0.2–1.2)
BUN: 10 mg/dL (ref 6–23)
CALCIUM: 9.3 mg/dL (ref 8.4–10.5)
CO2: 30 mEq/L (ref 19–32)
Chloride: 102 mEq/L (ref 96–112)
Creatinine, Ser: 1.1 mg/dL (ref 0.40–1.50)
GFR: 83.51 mL/min (ref >60.00)
Glucose, Bld: 97 mg/dL (ref 70–99)
Potassium: 4.6 mEq/L (ref 3.5–5.1)
Sodium: 138 mEq/L (ref 135–145)
TOTAL PROTEIN: 7.1 g/dL (ref 6.0–8.3)

## 2017-10-27 LAB — URIC ACID: Uric Acid, Serum: 8.4 mg/dL — ABNORMAL HIGH (ref 4.0–7.8)

## 2017-10-27 NOTE — Telephone Encounter (Signed)
Patient notified and voiced understanding. Appointment scheduled for 2 week follow up.

## 2017-10-27 NOTE — Telephone Encounter (Signed)
Please inform patient the following information: - his uric acid is elevate--> suggesting gout. - his other blood work did not show evidence of infection, low glucose or electrolyte abnormality,  or anemia to explain cause of his dizziness.  - still waiting on urine cx, do not suspect it will be positive since his POCT was good, but we will call if positive.  - HYDRATE- at least 100 ounces of water day.  - PCP in 2 weeks for follow up and discuss daily med for gout of needed and if dizziness remains after hydrating.

## 2017-10-30 ENCOUNTER — Encounter: Payer: Self-pay | Admitting: Family Medicine

## 2017-11-12 ENCOUNTER — Ambulatory Visit: Payer: BC Managed Care – PPO | Admitting: Family Medicine

## 2017-11-25 ENCOUNTER — Ambulatory Visit: Payer: BC Managed Care – PPO | Admitting: Family Medicine

## 2017-11-25 ENCOUNTER — Encounter: Payer: Self-pay | Admitting: Family Medicine

## 2017-11-25 NOTE — Progress Notes (Deleted)
OFFICE VISIT  11/25/2017   CC: No chief complaint on file.   HPI:    Patient is a 32 y.o. Caucasian male who presents for 1 mo f/u gout, most recent flare was in  L ankle 1 mo ago. Treated with prednisone.  Hx of gout flares in big toe in the past x multiple episodes, was on gout preventative med in the past but thinks he recalls that it made him too nauseous to take. CBC, CMET were normal at the time of most recent visit.  Uric acid was 8.4.  He returns to discuss starting daily preventative gout medication today.   Past Medical History:  Diagnosis Date  . Abscess    left buttock  . ALLERGIC RHINITIS 05/28/2007  . Complication of anesthesia   . GERD (gastroesophageal reflux disease)   . Headache(784.0)   . Hemorrhoid   . Pilonidal cyst   . Pneumonia 3-4 years ago  . PONV (postoperative nausea and vomiting)     Past Surgical History:  Procedure Laterality Date  . FRACTURE SURGERY  2009   right wrist  . INGUINAL HERNIA REPAIR N/A 12/18/2015   Procedure: LAPAROSCOPIC BILATERAL INGUINAL HERNIA REPAIR;  Surgeon: De Blanch Kinsinger, MD;  Location: [facility];  Service: General;  Laterality: N/A;  . INSERTION OF MESH N/A 12/18/2015   Procedure: INSERTION OF MESH;  Surgeon: De Blanch Kinsinger, MD;  Location: [facility];  Service: General;  Laterality: N/A;  . PILONIDAL CYST EXCISION  12/30/10  . PILONIDAL CYST EXCISION  04/08/2011   Procedure: CYST EXCISION PILONIDAL SIMPLE;  Surgeon: Wilmon Arms. Corliss Skains, MD;  Location: [facility];  Service: General;  Laterality: N/A;  Excision of chronic pilonidal abscess  . TONSILLECTOMY      Outpatient Medications Prior to Visit  Medication Sig Dispense Refill  . fluticasone (FLONASE) 50 MCG/ACT nasal spray Place into both nostrils daily.    Marland Kitchen omeprazole (PRILOSEC) 40 MG capsule Take 1 capsule (40 mg total) by mouth daily. 90 capsule 3  . OVER THE COUNTER MEDICATION Take 1 tablet by mouth daily as needed (allergy).    . predniSONE (DELTASONE) 20 MG tablet 60  mg x3d, 40 mg x3d, 20 mg x2d, 10 mg x2d 18 tablet 0   No facility-administered medications prior to visit.     Allergies  Allergen Reactions  . Penicillins Anaphylaxis    Of throat Has patient had a PCN reaction causing immediate rash, facial/tongue/throat swelling, SOB or lightheadedness with hypotension: yes Has patient had a PCN reaction causing severe rash involving mucus membranes or skin necrosis: yes Has patient had a PCN reaction that required hospitalization yes Has patient had a PCN reaction occurring within the last 10 years: yes If all of the above answers are "NO", then may proceed with Cephalosporin use.  . Clarithromycin     REACTION: SOB, Palpitations  . Sulfamethoxazole-Trimethoprim     Patient unsure of reaction.  . Levaquin [Levofloxacin] Other (See Comments)    dizzy    ROS As per HPI  PE: There were no vitals taken for this visit. ***  LABS:   10/26/17: urine culture NEG.  Lab Results  Component Value Date   TSH 1.12 10/18/2013   Lab Results  Component Value Date   WBC 9.8 10/26/2017   HGB 15.1 10/26/2017   HCT 44.7 10/26/2017   MCV 90.0 10/26/2017   PLT 231.0 10/26/2017   Lab Results  Component Value Date   CREATININE 1.10 10/26/2017   BUN 10 10/26/2017  NA 138 10/26/2017   K 4.6 10/26/2017   CL 102 10/26/2017   CO2 30 10/26/2017   Lab Results  Component Value Date   ALT 24 10/26/2017   AST 17 10/26/2017   ALKPHOS 55 10/26/2017   BILITOT 0.4 10/26/2017   Lab Results  Component Value Date   CHOL 163 02/16/2006   No results found for: HDL No results found for: [facility] Lab Results  Component Value Date   TRIG 53 02/16/2006   Lab Results  Component Value Date   LABURIC 8.4 (H) 10/26/2017    IMPRESSION AND PLAN:  No problem-specific Assessment & Plan notes found for this encounter.   FOLLOW UP: No follow-ups on file.

## 2018-02-25 ENCOUNTER — Ambulatory Visit: Payer: BC Managed Care – PPO | Admitting: Family Medicine

## 2018-02-25 ENCOUNTER — Encounter: Payer: Self-pay | Admitting: Family Medicine

## 2018-02-25 VITALS — BP 143/89 | HR 84 | Temp 98.3°F | Resp 16 | Ht 72.0 in | Wt 223.4 lb

## 2018-02-25 DIAGNOSIS — J209 Acute bronchitis, unspecified: Secondary | ICD-10-CM | POA: Diagnosis not present

## 2018-02-25 DIAGNOSIS — J019 Acute sinusitis, unspecified: Secondary | ICD-10-CM | POA: Diagnosis not present

## 2018-02-25 MED ORDER — DOXYCYCLINE HYCLATE 100 MG PO CAPS: 100.0000 mg | ORAL_CAPSULE | Freq: Two times a day (BID) | ORAL | 0 refills | Status: AC

## 2018-02-25 MED ORDER — PREDNISONE 20 MG PO TABS: ORAL_TABLET | ORAL | 0 refills | Status: DC

## 2018-02-25 NOTE — Progress Notes (Signed)
OFFICE VISIT  02/25/2018   CC:  Chief Complaint  Patient presents with  . URI   HPI:    Patient is a 32 y.o. Caucasian male who presents for respiratory symptoms. Onset about 1 mo ago with cough, nasal congestion, R ear feels stopped up/decreased hearing. Yellow/green nasal drainage, PND. No wheezing or SOB. T 99-100 nightly the last few days. Tried afrin, sudafed, mucinex DM. Saline nasal spray as well. He does not smoke. Eating and drinking fine.  No HA.    Past Medical History:  Diagnosis Date  . Abscess    left buttock  . ALLERGIC RHINITIS 05/28/2007  . Complication of anesthesia   . GERD (gastroesophageal reflux disease)   . Headache(784.0)   . Hemorrhoid   . Pilonidal cyst   . Pneumonia 3-4 years ago  . PONV (postoperative nausea and vomiting)     Past Surgical History:  Procedure Laterality Date  . FRACTURE SURGERY  2009   right wrist  . INGUINAL HERNIA REPAIR N/A 12/18/2015   Procedure: LAPAROSCOPIC BILATERAL INGUINAL HERNIA REPAIR;  Surgeon: De Blanch Kinsinger, MD;  Location: [facility];  Service: General;  Laterality: N/A;  . INSERTION OF MESH N/A 12/18/2015   Procedure: INSERTION OF MESH;  Surgeon: De Blanch Kinsinger, MD;  Location: [facility];  Service: General;  Laterality: N/A;  . PILONIDAL CYST EXCISION  12/30/10  . PILONIDAL CYST EXCISION  04/08/2011   Procedure: CYST EXCISION PILONIDAL SIMPLE;  Surgeon: Wilmon Arms. Corliss Skains, MD;  Location: [facility];  Service: General;  Laterality: N/A;  Excision of chronic pilonidal abscess  . TONSILLECTOMY      Outpatient Medications Prior to Visit  Medication Sig Dispense Refill  . omeprazole (PRILOSEC) 40 MG capsule Take 1 capsule (40 mg total) by mouth daily. 90 capsule 3  . OVER THE COUNTER MEDICATION Take 1 tablet by mouth daily as needed (allergy).    . fluticasone (FLONASE) 50 MCG/ACT nasal spray Place into both nostrils daily.    . predniSONE (DELTASONE) 20 MG tablet 60 mg x3d, 40 mg x3d, 20 mg x2d, 10 mg x2d (Patient  not taking: Reported on 02/25/2018) 18 tablet 0   No facility-administered medications prior to visit.     Allergies  Allergen Reactions  . Penicillins Anaphylaxis    Of throat Has patient had a PCN reaction causing immediate rash, facial/tongue/throat swelling, SOB or lightheadedness with hypotension: yes Has patient had a PCN reaction causing severe rash involving mucus membranes or skin necrosis: yes Has patient had a PCN reaction that required hospitalization yes Has patient had a PCN reaction occurring within the last 10 years: yes If all of the above answers are "NO", then may proceed with Cephalosporin use.  . Clarithromycin     REACTION: SOB, Palpitations  . Sulfamethoxazole-Trimethoprim     Patient unsure of reaction.  . Levaquin [Levofloxacin] Other (See Comments)    dizzy    ROS As per HPI  PE: Blood pressure (!) 143/89, pulse 84, temperature 98.3 F (36.8 C), temperature source Oral, resp. rate 16, height 6' (1.829 m), weight 223 lb 6 oz (101.3 kg), SpO2 96 %. VS: noted--normal. Gen: alert, NAD, NONTOXIC APPEARING. HEENT: eyes without injection, drainage, or swelling.  Ears: EACs clear, TMs with normal light reflex and landmarks.  Nose: Clear rhinorrhea, with some dried, crusty exudate adherent to mildly injected mucosa.  No purulent d/c.  Mild diffuse paranasal sinus TTP.  No facial swelling.  Throat and mouth without focal lesion.  No pharyngial swelling,  erythema, or exudate.  He does have a small aphthous ulcer on right side of soft palate. Neck: supple, no LAD.   LUNGS: CTA bilat except occ insp rhonchi, nonlabored resps.  Good aeration. CV: RRR, no m/r/g. EXT: no c/c/e SKIN: no rash    LABS:    Chemistry      Component Value Date/Time   NA 138 10/26/2017 1444   K 4.6 10/26/2017 1444   CL 102 10/26/2017 1444   CO2 30 10/26/2017 1444   BUN 10 10/26/2017 1444   CREATININE 1.10 10/26/2017 1444   CREATININE 0.92 08/04/2014 1540      Component Value  Date/Time   CALCIUM 9.3 10/26/2017 1444   ALKPHOS 55 10/26/2017 1444   AST 17 10/26/2017 1444   ALT 24 10/26/2017 1444   BILITOT 0.4 10/26/2017 1444      IMPRESSION AND PLAN:  Acute sinusitis and acute bronchitis vs acute bronchopneumonia. Doxycycline 100 mg bid x 7d. Prednisone 40mg  qd x 5d, then 20 mg qd x 5d. Get otc generic robitussin DM OR Mucinex DM and use as directed on the packaging for cough and congestion. Use otc generic saline nasal spray 2-3 times per day to irrigate/moisturize your nasal passages. Salt water gargle for aphthous ulcer pain.  An After Visit Summary was printed and given to the patient.  FOLLOW UP: Return if symptoms worsen or fail to improve.  Signed:  Santiago Bumpers, MD           02/25/2018

## 2018-03-22 ENCOUNTER — Encounter: Payer: Self-pay | Admitting: Family Medicine

## 2018-03-22 ENCOUNTER — Ambulatory Visit: Payer: Self-pay

## 2018-03-22 ENCOUNTER — Ambulatory Visit: Payer: BC Managed Care – PPO | Admitting: Family Medicine

## 2018-03-22 ENCOUNTER — Encounter: Payer: Self-pay | Admitting: *Deleted

## 2018-03-22 ENCOUNTER — Ambulatory Visit (HOSPITAL_BASED_OUTPATIENT_CLINIC_OR_DEPARTMENT_OTHER)
Admission: RE | Admit: 2018-03-22 | Discharge: 2018-03-22 | Disposition: A | Discharge status: Home / Self Care | Payer: BC Managed Care – PPO | Source: Ambulatory Visit | Attending: Family Medicine | Admitting: Family Medicine

## 2018-03-22 VITALS — BP 135/79 | HR 66 | Temp 98.4°F | Resp 16 | Ht 72.0 in | Wt 222.0 lb

## 2018-03-22 DIAGNOSIS — K2100 Gastro-esophageal reflux disease with esophagitis, without bleeding: Secondary | ICD-10-CM

## 2018-03-22 DIAGNOSIS — R0789 Other chest pain: Secondary | ICD-10-CM | POA: Diagnosis not present

## 2018-03-22 DIAGNOSIS — R05 Cough: Secondary | ICD-10-CM | POA: Diagnosis present

## 2018-03-22 DIAGNOSIS — K2900 Acute gastritis without bleeding: Secondary | ICD-10-CM | POA: Diagnosis not present

## 2018-03-22 DIAGNOSIS — J209 Acute bronchitis, unspecified: Secondary | ICD-10-CM | POA: Diagnosis not present

## 2018-03-22 DIAGNOSIS — R059 Cough, unspecified: Secondary | ICD-10-CM

## 2018-03-22 MED ORDER — PANTOPRAZOLE SODIUM 40 MG PO TBEC: 40.0000 mg | DELAYED_RELEASE_TABLET | Freq: Every day | ORAL | 1 refills | Status: DC

## 2018-03-22 MED ORDER — BENZONATATE 200 MG PO CAPS: ORAL_CAPSULE | ORAL | 1 refills | Status: DC

## 2018-03-22 MED ORDER — SUCRALFATE 1 G PO TABS: 1.0000 g | ORAL_TABLET | Freq: Three times a day (TID) | ORAL | 1 refills | Status: DC

## 2018-03-22 NOTE — Patient Instructions (Signed)
Food Choices for Gastroesophageal Reflux Disease, Adult When you have gastroesophageal reflux disease (GERD), the foods you eat and your eating habits are very important. Choosing the right foods can help ease the discomfort of GERD. Consider working with a diet and nutrition specialist (dietitian) to help you make healthy food choices. What general guidelines should I follow? Eating plan  Choose healthy foods low in fat, such as fruits, vegetables, whole grains, low-fat dairy products, and lean meat, fish, and poultry.  Eat frequent, small meals instead of three large meals each day. Eat your meals slowly, in a relaxed setting. Avoid bending over or lying down until 2-3 hours after eating.  Limit high-fat foods such as fatty meats or fried foods.  Limit your intake of oils, butter, and shortening to less than 8 teaspoons each day.  Avoid the following: ? Foods that cause symptoms. These may be different for different people. Keep a food diary to keep track of foods that cause symptoms. ? Alcohol. ? Drinking large amounts of liquid with meals. ? Eating meals during the 2-3 hours before bed.  Cook foods using methods other than frying. This may include baking, grilling, or broiling. Lifestyle   Maintain a healthy weight. Ask your health care provider what weight is healthy for you. If you need to lose weight, work with your health care provider to do so safely.  Exercise for at least 30 minutes on 5 or more days each week, or as told by your health care provider.  Avoid wearing clothes that fit tightly around your waist and chest.  Do not use any products that contain nicotine or tobacco, such as cigarettes and e-cigarettes. If you need help quitting, ask your health care provider.  Sleep with the head of your bed raised. Use a wedge under the mattress or blocks under the bed frame to raise the head of the bed. What foods are not recommended? The items listed may not be a complete  list. Talk with your dietitian about what dietary choices are best for you. Grains Pastries or quick breads with added fat. French toast. Vegetables Deep fried vegetables. French fries. Any vegetables prepared with added fat. Any vegetables that cause symptoms. For some people this may include tomatoes and tomato products, chili peppers, onions and garlic, and horseradish. Fruits Any fruits prepared with added fat. Any fruits that cause symptoms. For some people this may include citrus fruits, such as oranges, grapefruit, pineapple, and lemons. Meats and other protein foods High-fat meats, such as fatty beef or pork, hot dogs, ribs, ham, sausage, salami and bacon. Fried meat or protein, including fried fish and fried chicken. Nuts and nut butters. Dairy Whole milk and chocolate milk. Sour cream. Cream. Ice cream. Cream cheese. Milk shakes. Beverages Coffee and tea, with or without caffeine. Carbonated beverages. Sodas. Energy drinks. Fruit juice made with acidic fruits (such as orange or grapefruit). Tomato juice. Alcoholic drinks. Fats and oils Butter. Margarine. Shortening. Ghee. Sweets and desserts Chocolate and cocoa. Donuts. Seasoning and other foods Pepper. Peppermint and spearmint. Any condiments, herbs, or seasonings that cause symptoms. For some people, this may include curry, hot sauce, or vinegar-based salad dressings. Summary  When you have gastroesophageal reflux disease (GERD), food and lifestyle choices are very important to help ease the discomfort of GERD.  Eat frequent, small meals instead of three large meals each day. Eat your meals slowly, in a relaxed setting. Avoid bending over or lying down until 2-3 hours after eating.  Limit high-fat   foods such as fatty meat or fried foods. This information is not intended to replace advice given to you by your health care provider. Make sure you discuss any questions you have with your health care provider. Document Released:  04/07/2005 Document Revised: 04/08/2016 Document Reviewed: 04/08/2016 Elsevier Interactive Patient Education  2018 [employer]  

## 2018-03-22 NOTE — Telephone Encounter (Signed)
Noted  

## 2018-03-22 NOTE — Telephone Encounter (Signed)
Pt has apt with Dr. Milinda CaveMcGowen today (03/22/18) at 1:00pm.

## 2018-03-22 NOTE — Progress Notes (Signed)
OFFICE VISIT  03/22/2018   CC:  Chief Complaint  Patient presents with  . Follow-up    bronchitis, pt feels like it is getting worse.    HPI:    Patient is a 32 y.o. Caucasian male who presents for ongoing respiratory symptoms. I saw him about 3 wks ago and dx'd him with acute sinusitis and acute bronchitis vs acute bronchopneumonia.  He had been having sx's for about 1 month at that time. I treated him with doxycycline x 10d and prednisone 10 day taper.  Interim Hx: It is difficult to tell if pt improved with the above mentioned treatments after last visit.  He is not able to be clear despite my pointed questioning about this today. Nevertheless, he says he got a cough around a week ago, some mucous production that is thick.  Feels dull constant pain in lower sternal and upper epigastric region.  When he lays on either side or supine he feels pain around lower aspect of sternum.  He sits upright and still has mild discomfort.  If he stands up and stands very erect he feels fine.  No fevers.  Eating makes epigastric/substernal pain worse.  Also has been nauseated on and off.   He gets large volumes of liquid/saliva in mouth after meals sometimes and when trying to sleep at night.  No ST.  No SOB.  Some dizziness when he had signif substernal pain. No palpitations. No diarrhea.  No NSAID use. He is taking otc dose of nexium daily and tums and neither of these helps. Minimal nasal sx's--no sinus pressure or HAs.  Past Medical History:  Diagnosis Date  . Abscess    left buttock  . ALLERGIC RHINITIS 05/28/2007  . Complication of anesthesia   . GERD (gastroesophageal reflux disease)   . Headache(784.0)   . Hemorrhoid   . Pilonidal cyst   . Pneumonia 3-4 years ago  . PONV (postoperative nausea and vomiting)     Past Surgical History:  Procedure Laterality Date  . FRACTURE SURGERY  2009   right wrist  . INGUINAL HERNIA REPAIR N/A 12/18/2015   Procedure: LAPAROSCOPIC BILATERAL INGUINAL  HERNIA REPAIR;  Surgeon: De Blanch Kinsinger, MD;  Location: [facility];  Service: General;  Laterality: N/A;  . INSERTION OF MESH N/A 12/18/2015   Procedure: INSERTION OF MESH;  Surgeon: De Blanch Kinsinger, MD;  Location: [facility];  Service: General;  Laterality: N/A;  . PILONIDAL CYST EXCISION  12/30/10  . PILONIDAL CYST EXCISION  04/08/2011   Procedure: CYST EXCISION PILONIDAL SIMPLE;  Surgeon: Wilmon Arms. Corliss Skains, MD;  Location: [facility];  Service: General;  Laterality: N/A;  Excision of chronic pilonidal abscess  . TONSILLECTOMY      Outpatient Medications Prior to Visit  Medication Sig Dispense Refill  . omeprazole (PRILOSEC) 40 MG capsule Take 1 capsule (40 mg total) by mouth daily. 90 capsule 3  . OVER THE COUNTER MEDICATION Take 1 tablet by mouth daily as needed (allergy).    . predniSONE (DELTASONE) 20 MG tablet 2 tabs po qd x 5d, then 1 tab po qd x 5d (Patient not taking: Reported on 03/22/2018) 15 tablet 0   No facility-administered medications prior to visit.     Allergies  Allergen Reactions  . Penicillins Anaphylaxis    Of throat Has patient had a PCN reaction causing immediate rash, facial/tongue/throat swelling, SOB or lightheadedness with hypotension: yes Has patient had a PCN reaction causing severe rash involving mucus membranes or skin necrosis: yes  Has patient had a PCN reaction that required hospitalization yes Has patient had a PCN reaction occurring within the last 10 years: yes If all of the above answers are "NO", then may proceed with Cephalosporin use.  . Clarithromycin     REACTION: SOB, Palpitations  . Sulfamethoxazole-Trimethoprim     Patient unsure of reaction.  . Levaquin [Levofloxacin] Other (See Comments)    dizzy    ROS As per HPI  PE: Blood pressure 135/79, pulse 66, temperature 98.4 F (36.9 C), temperature source Oral, resp. rate 16, height 6' (1.829 m), weight 222 lb (100.7 kg), SpO2 100 %. VS: noted--normal. Gen: alert, NAD, NONTOXIC  APPEARING. HEENT: eyes without injection, drainage, or swelling.  Ears: EACs clear, TMs with normal light reflex and landmarks.  Nose: Clear rhinorrhea, with some dried, crusty exudate adherent to mildly injected mucosa.  No purulent d/c.  No paranasal sinus TTP.  No facial swelling.  Throat and mouth without focal lesion.  No pharyngial swelling, erythema, or exudate.   Neck: supple, no LAD.   LUNGS: CTA bilat, nonlabored resps.  Chest wall: lowest aspect of sternum with some tenderness. Abd: upper abdomen TTP in midline.  No mass, HSM, bruit, or distention.  No guarding or rebound tenderness.  Normal bowel sounds. CV: RRR, no m/r/g. EXT: no c/c/e SKIN: no rash    LABS:    Chemistry      Component Value Date/Time   NA 138 10/26/2017 1444   K 4.6 10/26/2017 1444   CL 102 10/26/2017 1444   CO2 30 10/26/2017 1444   BUN 10 10/26/2017 1444   CREATININE 1.10 10/26/2017 1444   CREATININE 0.92 08/04/2014 1540      Component Value Date/Time   CALCIUM 9.3 10/26/2017 1444   ALKPHOS 55 10/26/2017 1444   AST 17 10/26/2017 1444   ALT 24 10/26/2017 1444   BILITOT 0.4 10/26/2017 1444     12 lead EKG today: NSR, rate 64, tiny RSR' in V1 and V2, with J point elevation in V2 and V3, low voltage in III and aVL, delayed R wave progression, and isolated TWI in III and V1.  Intervals and duration normal.  No ectopy.  No hypertrophy. EKG w/in normal limits. Compared to EKG 07/24/14, no change.  IMPRESSION AND PLAN:  1) Gastritis/dyspepsia, with GERD with esophagitis. Plan: pantoprazole 40mg  bid.  Carafate 1g qid prn.  Therapeutic expectations and side effect profile of medication discussed today.  Patient's questions answered. Tessalon pearls to suppress cough and thereby avoid wide fluctuations in gastric pressure.  2) Cough: he has mild bronchitis. Given unclear history (?cough ever go away after last visit?) + pain in lower chest/sternum that gets worse with supine positioning, will check CXR to  make sure no cardiac silhouette changes to suggest pericarditis and also further eval for infiltrate. EKG today normal. Tessalon pearls as noted in #1 above.  An After Visit Summary was printed and given to the patient.  FOLLOW UP: Return in about 2 weeks (around 04/05/2018) for f/u GERD/Gastritis w/esophagitis.  If not signif improved will either check upper GI/barium swallow OR ask GI to see him (or both). Also, consider abd u/s to eval for gallstones.  Signed:  Santiago BumpersPhil Arkel Cartwright, MD           03/22/2018

## 2018-03-22 NOTE — Telephone Encounter (Signed)
Pt. Reports he is still coughing from his bronchitis. Developed chest pain last Thursday. Felt like is could be acid reflux.He was at the beach and came home early, because of feeling sick. Has pain at the "breast bone." Denies any radiation, shortness of breath,sweating. Pain comes and goes. States he has to sit up sometimes at night. No pain today. Appointment made per agent. Instructed if chest returned with added symptoms, to go to ED for evaluation. Verbalizes understanding.  Reason for Disposition . [1] Chest pain lasts > 5 minutes AND [2] occurred > 3 days ago (72 hours) AND [3] NO chest pain or cardiac symptoms now  Answer Assessment - Initial Assessment Questions 1. LOCATION: "Where does it hurt?"       Middle of the chest 2. RADIATION: "Does the pain go anywhere else?" (e.g., into neck, jaw, arms, back)     No 3. ONSET: "When did the chest pain begin?" (Minutes, hours or days)      Last Thursday 4. PATTERN "Does the pain come and go, or has it been constant since it started?"  "Does it get worse with exertion?"      Comes and goes 5. DURATION: "How long does it last" (e.g., seconds, minutes, hours)     Last 20 minutes 6. SEVERITY: "How bad is the pain?"  (e.g., Scale 1-10; mild, moderate, or severe)    - MILD (1-3): doesn't interfere with normal activities     - MODERATE (4-7): interferes with normal activities or awakens from sleep    - SEVERE (8-10): excruciating pain, unable to do any normal activities       8 7. CARDIAC RISK FACTORS: "Do you have any history of heart problems or risk factors for heart disease?" (e.g., prior heart attack, angina; high blood pressure, diabetes, being overweight, high cholesterol, smoking, or strong family history of heart disease)     No 8. PULMONARY RISK FACTORS: "Do you have any history of lung disease?"  (e.g., blood clots in lung, asthma, emphysema, birth control pills)     No 9. CAUSE: "What do you think is causing the chest pain?"  Maybe reflux 10. OTHER SYMPTOMS: "Do you have any other symptoms?" (e.g., dizziness, nausea, vomiting, sweating, fever, difficulty breathing, cough)       Some nausea 11. PREGNANCY: "Is there any chance you are pregnant?" "When was your last menstrual period?"       n/a  Protocols used: CHEST PAIN-A-AH

## 2018-04-05 ENCOUNTER — Ambulatory Visit: Payer: BC Managed Care – PPO | Admitting: Family Medicine

## 2018-04-21 DIAGNOSIS — R0789 Other chest pain: Secondary | ICD-10-CM

## 2018-04-21 HISTORY — DX: Other chest pain: R07.89

## 2018-05-09 IMAGING — CT CT ABD-PELV W/ CM
3 of 4 series · 12 of 36 positions shown, 18 images · IV contrast (READICAT/WATER & [ID] ISOVUE 300)
Comparison: None.

CLINICAL DATA: Lower abdominal pain with nausea, vomiting, and
diarrhea. Prior inguinal hernia repairs

EXAM:
CT ABDOMEN AND PELVIS WITH CONTRAST
TECHNIQUE: Multidetector CT imaging of the abdomen and pelvis was performed
using the standard protocol following bolus administration of
intravenous contrast. Oral contrast was also administered.
CONTRAST:  125mL C08BKY-9LL IOPAMIDOL (C08BKY-9LL) INJECTION 61%

[Series 3: abd/pelvis with · axial · 0.78mm/px · z∈[-387,-27]mm · 8 of 94 slices shown, 13 images]
[im 11/94  soft-tissue]
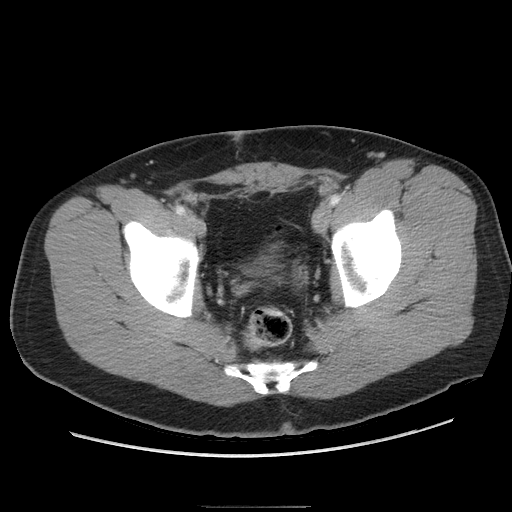
[im 11/94  bone]
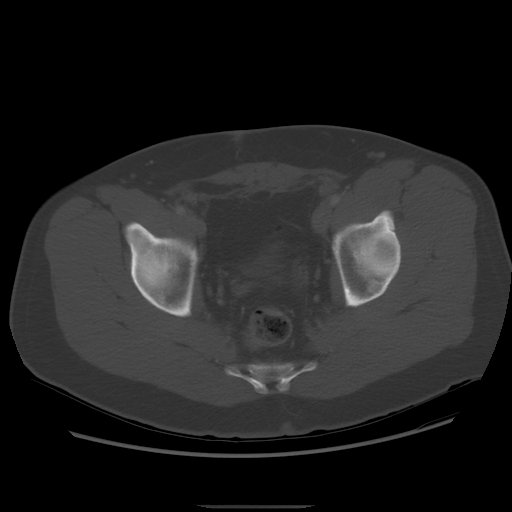
[im 21/94  soft-tissue]
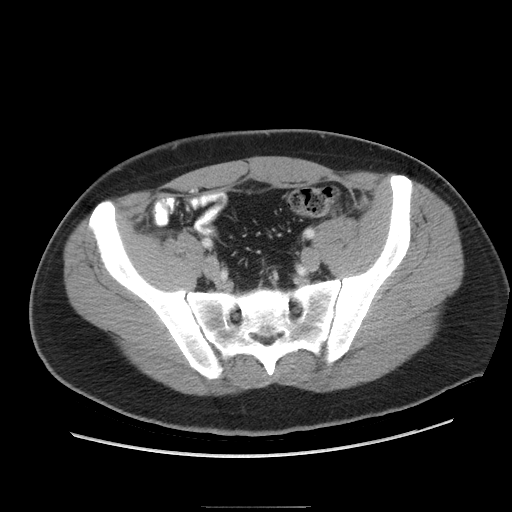
[im 32/94  soft-tissue]
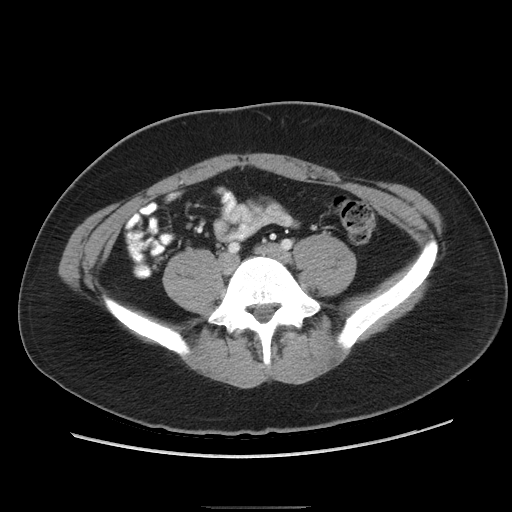
[im 42/94  soft-tissue]
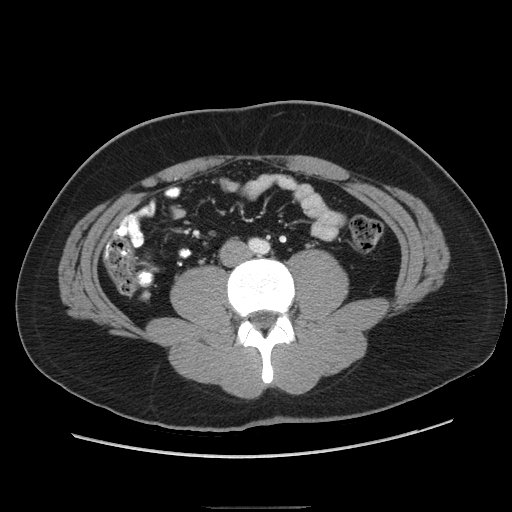
[im 52/94  soft-tissue]
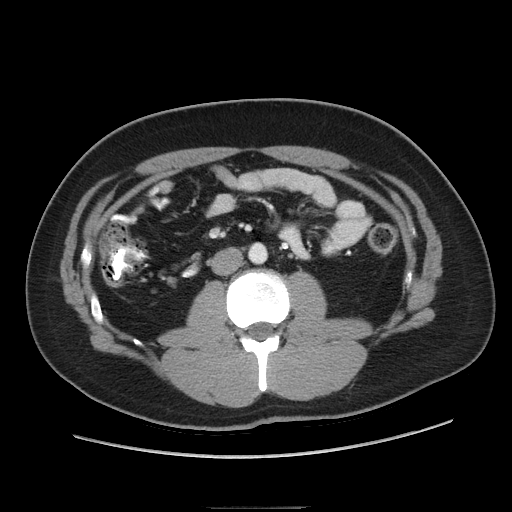
[im 52/94  lung]
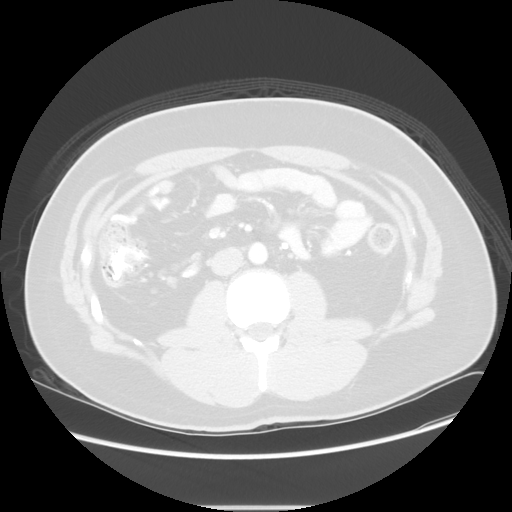
[im 63/94  soft-tissue]
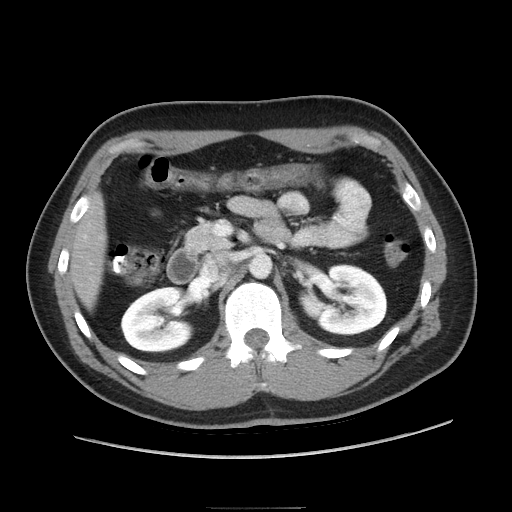
[im 63/94  lung]
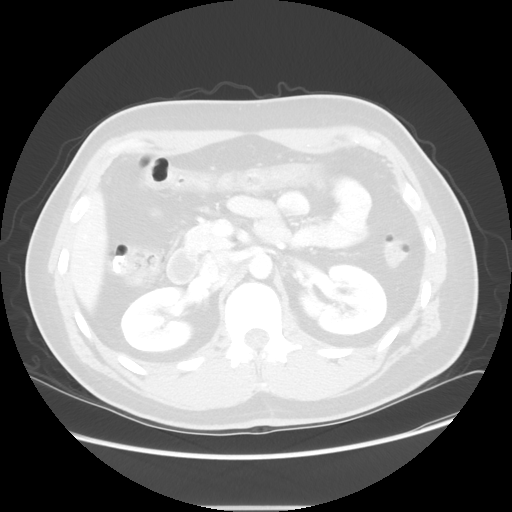
[im 73/94  soft-tissue]
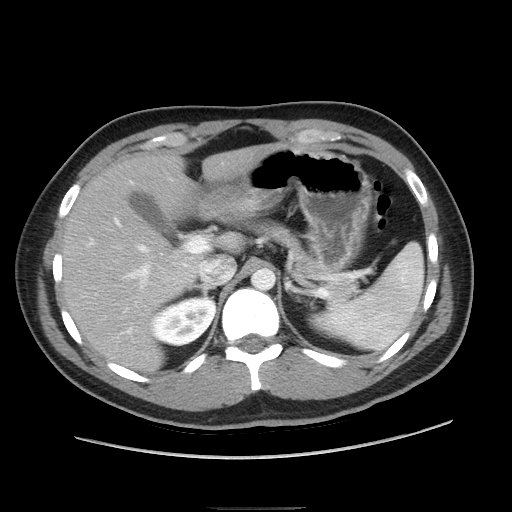
[im 73/94  lung]
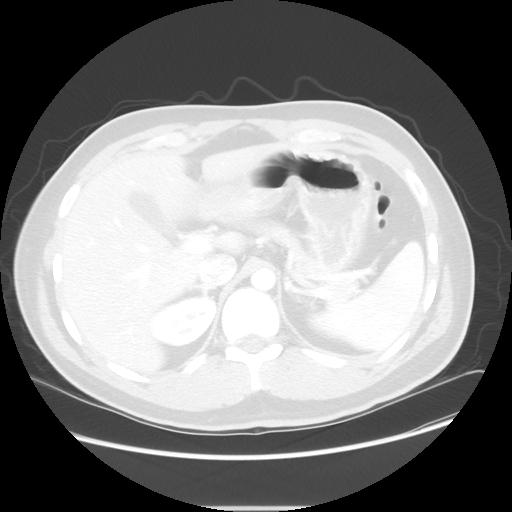
[im 83/94  soft-tissue]
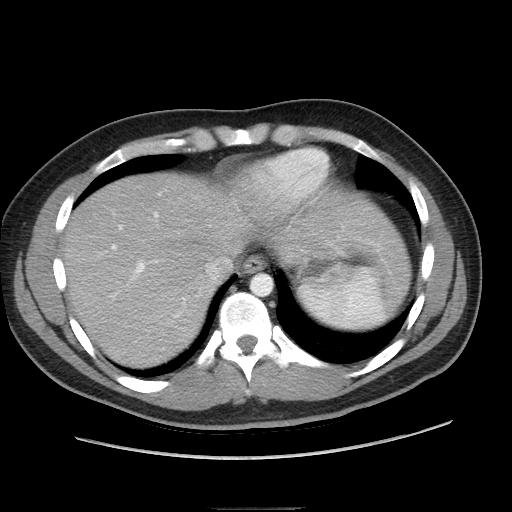
[im 83/94  lung]
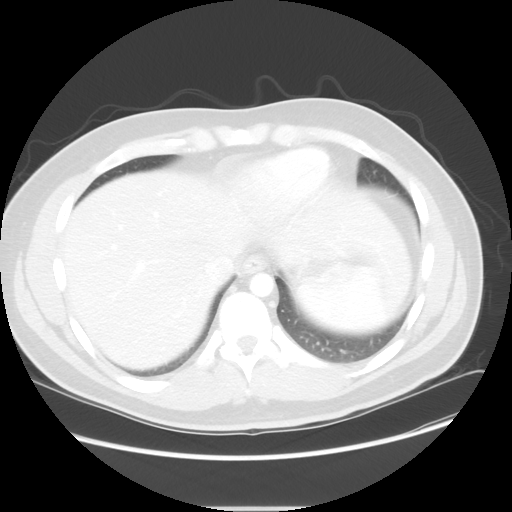

[Series 601: coronal body · coronal · 1.00mm/px · 1 of 112 slices shown, 2 images]
[im 38/112  soft-tissue]
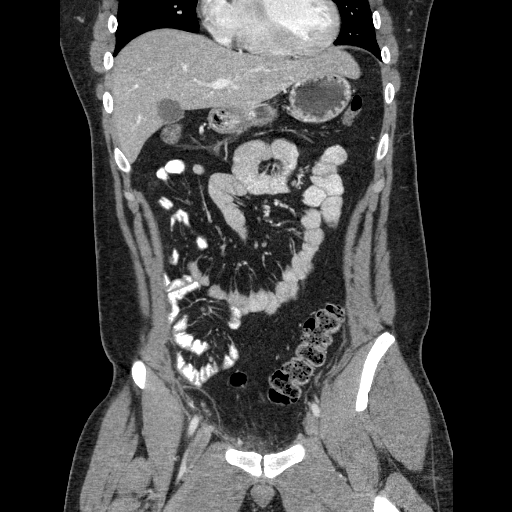
[im 38/112  bone]
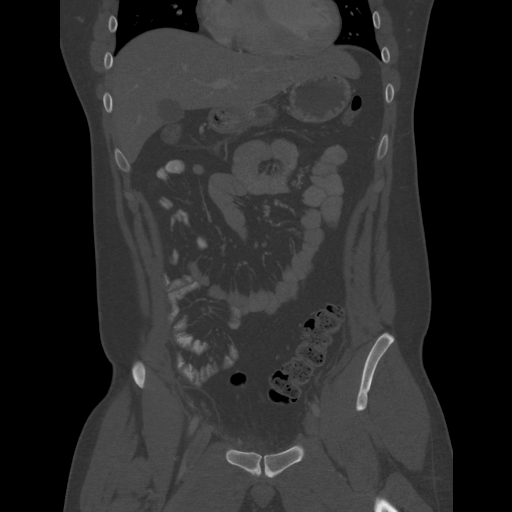

[Series 602: sagittal body · sagittal · 1.00mm/px · 3 of 161 slices shown]
[im 18/161  soft-tissue]
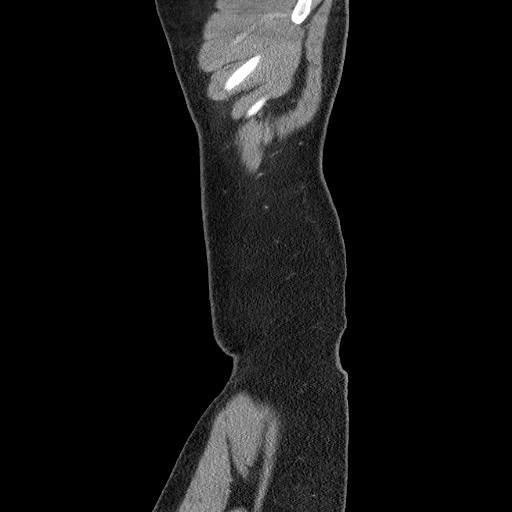
[im 36/161  soft-tissue]
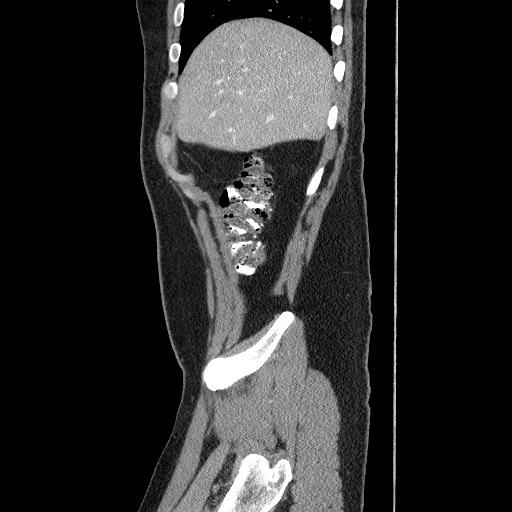
[im 54/161  soft-tissue]
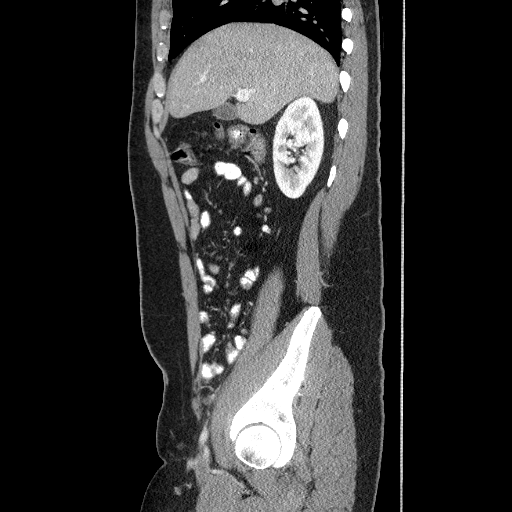

[12 of 36 positions shown; findings below may reference images not displayed]

FINDINGS: Lower chest: Lung bases are clear.

Hepatobiliary: There is hepatic steatosis. No focal liver lesions
are appreciable. Gallbladder wall is not thickened. There is no
biliary duct dilatation.

Pancreas: No pancreatic mass or inflammatory focus.

Spleen: No splenic lesions are evident.

Adrenals/Urinary Tract: Adrenals appear normal bilaterally. Kidneys
bilaterally show no demonstrable mass or hydronephrosis on either
side. There is no renal or ureteral calculus on either side. Urinary
bladder is midline with wall thickness within normal limits.

Stomach/Bowel: There is no bowel wall or mesenteric thickening.
There are scattered colonic diverticula. No diverticulitis. Note
that the transverse colon currently is decompressed. No bowel
obstruction. No free air or portal venous air.

Vascular/Lymphatic: There is no apparent abdominal aortic aneurysm.
Major mesenteric vessels are patent. No vascular lesions are
evident. No adenopathy is appreciable in the abdomen or pelvis.

Reproductive: Prostate and seminal vesicles appear unremarkable.
There is no pelvic mass or pelvic fluid collection.

Other: Patient has undergone inguinal hernia repairs bilaterally.
There is apparent scarring in the anterior pelvis without abnormal
fluid collection. There is also left periumbilical soft tissue
stranding in the abdominal wall, likely due to scarring. There is a
minimal ventral hernia containing only fat. The appendix appears
normal. No abscess or ascites is evident in the abdomen or pelvis.

Musculoskeletal: There are no blastic or lytic bone lesions. There
is no intramuscular lesion evident.
IMPRESSION: Status post inguinal hernia repairs bilaterally with mild scarring
in the anterior pelvis as well as left periumbilical abdominal wall
regions. No abnormal fluid collection or evidence suggesting
abscess. There is no bowel wall thickening or bowel obstruction.
There are scattered colonic diverticula without diverticulitis.
Appendix appears normal. No renal or ureteral calculus. No
hydronephrosis. There is a degree of hepatic steatosis.

## 2018-12-06 ENCOUNTER — Telehealth: Payer: Self-pay | Admitting: Family Medicine

## 2018-12-06 NOTE — Telephone Encounter (Signed)
Patient called requesting appt for chest pains x 2 weeks. Been having nausea since last night. Patient feels his blood pressure is up. Please call.

## 2018-12-06 NOTE — Telephone Encounter (Signed)
Patient has had chest pain for the past 2 weeks. It has slowly gotten worse over the past couple of days. Patient is dizzy, nauseous, and has to sit down sometimes upon minor tasks. Patient does not have SOB, headaches and/or is vomiting. Patient stated that the chest pain starts out dull and then gets very sharp. He feels that his BP is high when he has chest pains. Called patient and advised him to go to the hospital after talking to me. Patient lives near [place] and will go to hospital near his home

## 2018-12-07 NOTE — Telephone Encounter (Signed)
Pt went to ED. Sent to Dr Anitra Lauth as a FYI.

## 2018-12-08 NOTE — Telephone Encounter (Signed)
Noted  

## 2019-07-13 ENCOUNTER — Other Ambulatory Visit: Payer: Self-pay

## 2019-07-13 ENCOUNTER — Ambulatory Visit: Payer: BC Managed Care – PPO | Admitting: Family Medicine

## 2019-07-13 ENCOUNTER — Encounter: Payer: Self-pay | Admitting: Family Medicine

## 2019-07-13 VITALS — BP 129/86 | HR 54 | Temp 98.2°F | Resp 16 | Ht 72.0 in | Wt 227.2 lb

## 2019-07-13 DIAGNOSIS — F410 Panic disorder [episodic paroxysmal anxiety] without agoraphobia: Secondary | ICD-10-CM

## 2019-07-13 DIAGNOSIS — F411 Generalized anxiety disorder: Secondary | ICD-10-CM | POA: Diagnosis not present

## 2019-07-13 MED ORDER — FLUOXETINE HCL 20 MG PO TABS: 20.0000 mg | ORAL_TABLET | Freq: Every day | ORAL | 0 refills | Status: DC

## 2019-07-13 NOTE — Progress Notes (Signed)
OFFICE VISIT  07/13/2019   CC:  Chief Complaint  Patient presents with  . Anxiety    he has a lot of stress occurring right now   HPI:    Patient is a 33 y.o. Caucasian male who presents for anxiety. GF passed a year ago, father d 1--still dealing with this. Wife lost job temporarily. Works two jobs.  "Stressed to the gills".  Has 2 young kids doing well. Sleep is good.  Some depressed mood for any persistent amount of time. 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  Irritable more lately.  Energy level is fine.  If he stays busy he feels better. Not drinking or doing any self medicating at all.  Concentration intact.  Says home life is fine but wife definitely strongly recommended he come to me to seek guidance/potential med help. Chest gets tight sometimes when very anxious. He bottles things up. He's felt overwhelmed with this stress for about the last 18 mo since life changes after GF passed. Has never been on a med for anxiety or depression.   Of note, in review of EMR today-->He went to ED 12/06/18 ([facility]) for atypical CP and diffuse paresthesias and some nausea.  I reviewed this record today. VS normal except mild HTN.  CBC, CMET, Mag, Troponins, CK/MBs, EKG, and CXR all normal. Pt reassured, NSAID or tylenol recommended prn, f/u with me recommended. He admits he feels like this was a panic attack, no others since.  Past Medical History:  Diagnosis Date  . Abscess    left buttock  . ALLERGIC RHINITIS 05/28/2007  . Atypical chest pain 2020   11/2018 ED eval-->reassuring/normal.  . GERD (gastroesophageal reflux disease)   . Headache(784.0)   . Hemorrhoid   . Pilonidal cyst   . Pneumonia 3-4 years ago    Past Surgical History:  Procedure Laterality Date  . FRACTURE SURGERY  2009   right wrist  . INGUINAL HERNIA REPAIR N/A 12/18/2015   Procedure: LAPAROSCOPIC BILATERAL INGUINAL HERNIA REPAIR;  Surgeon: De Blanch Kinsinger, MD;  Location: [facility];  Service: General;   Laterality: N/A;  . INSERTION OF MESH N/A 12/18/2015   Procedure: INSERTION OF MESH;  Surgeon: De Blanch Kinsinger, MD;  Location: [facility];  Service: General;  Laterality: N/A;  . PILONIDAL CYST EXCISION  12/30/10  . PILONIDAL CYST EXCISION  04/08/2011   Procedure: CYST EXCISION PILONIDAL SIMPLE;  Surgeon: Wilmon Arms. Corliss Skains, MD;  Location: [facility];  Service: General;  Laterality: N/A;  Excision of chronic pilonidal abscess  . TONSILLECTOMY      Outpatient Medications Prior to Visit  Medication Sig Dispense Refill  . omeprazole (PRILOSEC) 40 MG capsule Take 1 capsule (40 mg total) by mouth daily. 90 capsule 3  . benzonatate (TESSALON) 200 MG capsule 1 cap po tid prn cough (Patient not taking: Reported on 07/13/2019) 30 capsule 1  . OVER THE COUNTER MEDICATION Take 1 tablet by mouth daily as needed (allergy).    . pantoprazole (PROTONIX) 40 MG tablet Take 1 tablet (40 mg total) by mouth daily. (Patient not taking: Reported on 07/13/2019) 30 tablet 1  . sucralfate (CARAFATE) 1 g tablet Take 1 tablet (1 g total) by mouth 4 (four) times daily -  with meals and at bedtime. (Patient not taking: Reported on 07/13/2019) 30 tablet 1   No facility-administered medications prior to visit.    Allergies  Allergen Reactions  . Penicillins Anaphylaxis    Of throat Has patient had a PCN reaction causing  immediate rash, facial/tongue/throat swelling, SOB or lightheadedness with hypotension: yes Has patient had a PCN reaction causing severe rash involving mucus membranes or skin necrosis: yes Has patient had a PCN reaction that required hospitalization yes Has patient had a PCN reaction occurring within the last 10 years: yes If all of the above answers are "NO", then may proceed with Cephalosporin use.  . Clarithromycin     REACTION: SOB, Palpitations  . Sulfamethoxazole-Trimethoprim     Patient unsure of reaction.  . Levaquin [Levofloxacin] Other (See Comments)    dizzy    ROS As per HPI  PE: Blood  pressure 129/86, pulse (!) 54, temperature 98.2 F (36.8 C), temperature source Temporal, resp. rate 16, height 6' (1.829 m), weight 227 lb 3.2 oz (103.1 kg), SpO2 100 %. Wt Readings from Last 2 Encounters:  07/13/19 227 lb 3.2 oz (103.1 kg)  03/22/18 222 lb (100.7 kg)    Gen: alert, oriented x 4, affect pleasant.  Lucid thinking and conversation noted. HEENT: PERRLA, EOMI.   Neck: no LAD, mass, or thyromegaly. CV: RRR, no m/r/g LUNGS: CTA bilat, nonlabored. NEURO: no tremor or tics noted on observation.  Coordination intact. CN 2-12 grossly intact bilaterally, strength 5/5 in all extremeties.  No ataxia.  LABS:    Chemistry      Component Value Date/Time   NA 138 10/26/2017 1444   K 4.6 10/26/2017 1444   CL 102 10/26/2017 1444   CO2 30 10/26/2017 1444   BUN 10 10/26/2017 1444   CREATININE 1.10 10/26/2017 1444   CREATININE 0.92 08/04/2014 1540      Component Value Date/Time   CALCIUM 9.3 10/26/2017 1444   ALKPHOS 55 10/26/2017 1444   AST 17 10/26/2017 1444   ALT 24 10/26/2017 1444   BILITOT 0.4 10/26/2017 1444     Lab Results  Component Value Date   TSH 1.12 10/18/2013    IMPRESSION AND PLAN:  1) Chronic high level stress and anxiety, hx of panic attack. No depression. Start fluoxetine 20mg  qd.  Therapeutic expectations and side effect profile of medication discussed today.  Patient's questions answered. Empathetic listening, general wellness advise done today. Pt expressed understanding and agreement with plan.  An After Visit Summary was printed and given to the patient.  FOLLOW UP: Return in about 4 weeks (around 08/10/2019) for f/u stress/anxiety.  Signed:  Crissie Sickles, MD           07/13/2019

## 2019-07-18 ENCOUNTER — Encounter: Payer: Self-pay | Admitting: Family Medicine

## 2019-07-18 MED ORDER — CITALOPRAM HYDROBROMIDE 10 MG PO TABS: 10.0000 mg | ORAL_TABLET | Freq: Every day | ORAL | 0 refills | Status: DC

## 2019-07-18 NOTE — Telephone Encounter (Signed)
OK, stop prozac. I sent in the lowest dose of citalopram for him to try.  Wait a full 24h after last dose of prozac to start the citalopram.

## 2019-07-18 NOTE — Telephone Encounter (Signed)
Patient was started on Prozac during visit 07/15/19 but is having side effects such as dizziness and feeling sick on the stomach.  Please advise, thanks.

## 2019-08-08 ENCOUNTER — Other Ambulatory Visit: Payer: Self-pay

## 2019-08-10 ENCOUNTER — Encounter: Payer: Self-pay | Admitting: Family Medicine

## 2019-08-10 ENCOUNTER — Other Ambulatory Visit: Payer: Self-pay

## 2019-08-10 ENCOUNTER — Telehealth (INDEPENDENT_AMBULATORY_CARE_PROVIDER_SITE_OTHER): Payer: BC Managed Care – PPO | Admitting: Family Medicine

## 2019-08-10 DIAGNOSIS — F430 Acute stress reaction: Secondary | ICD-10-CM | POA: Diagnosis not present

## 2019-08-10 DIAGNOSIS — F410 Panic disorder [episodic paroxysmal anxiety] without agoraphobia: Secondary | ICD-10-CM

## 2019-08-10 MED ORDER — DIAZEPAM 2 MG PO TABS: ORAL_TABLET | ORAL | 0 refills | Status: DC

## 2019-08-10 NOTE — Progress Notes (Signed)
Virtual Visit via Video Note  I connected with pt on 08/10/19 at 11:30 AM EDT by a video enabled telemedicine application and verified that I am speaking with the correct person using two identifiers.  Location patient: home Location provider:work or home office Persons participating in the virtual visit: patient, provider  I discussed the limitations of evaluation and management by telemedicine and the availability of in person appointments. The patient expressed understanding and agreed to proceed.  Telemedicine visit is a necessity given the COVID-19 restrictions in place at the current time.  HPI: 34 y/o WM being seen today for 1 mo f/u chronic anxiety, hx of panic attack. Last visit I started him on fluoxetine 20mg  qd.  This med caused excessive dizzines/nausea so I switched him to citalopram 10mg  qd and he has been on this for about 3 wks now.  He was unable to tolerate the citalopram either. He does note that he felt mild improvement even though he only took each med for 4-5d. Stress levels still quite high, mood is fine.  Dealing with things/coping adequately sometimes and sometimes feeling overwhelmed.  No panic. Some people in family with cancer, has entire family stressed.   His wife was recently put on valium prn and pt states he took a 2mg  valium of hers one day and it helped signif with anxiety and lasted adequate duration and he had no adverse side effects.  He took a valium on another day and it helped similarly.  No drinking or drug use. He is functioning as normal (working full time, home and family responsibilities).  Past Medical History:  Diagnosis Date  . Abscess    left buttock  . ALLERGIC RHINITIS 05/28/2007  . Anxiety    Prozac trial 06/2019-->intol.  Cital 10mg  trial 07/18/19.  Marland Kitchen Atypical chest pain 2020   11/2018 ED eval-->reassuring/normal.  . GERD (gastroesophageal reflux disease)   . Headache(784.0)   . Hemorrhoid   . Pilonidal cyst   . Pneumonia 3-4  years ago    Past Surgical History:  Procedure Laterality Date  . FRACTURE SURGERY  2009   right wrist  . INGUINAL HERNIA REPAIR N/A 12/18/2015   Procedure: LAPAROSCOPIC BILATERAL INGUINAL HERNIA REPAIR;  Surgeon: Arta Bruce Kinsinger, MD;  Location: [facility];  Service: General;  Laterality: N/A;  . INSERTION OF MESH N/A 12/18/2015   Procedure: INSERTION OF MESH;  Surgeon: Arta Bruce Kinsinger, MD;  Location: [facility];  Service: General;  Laterality: N/A;  . PILONIDAL CYST EXCISION  12/30/10  . PILONIDAL CYST EXCISION  04/08/2011   Procedure: CYST EXCISION PILONIDAL SIMPLE;  Surgeon: Imogene Burn. Georgette Dover, MD;  Location: [facility];  Service: General;  Laterality: N/A;  Excision of chronic pilonidal abscess  . TONSILLECTOMY      Family History  Problem Relation Age of Onset  . HIV Mother   . HIV Father        d. age 25 (HIV likely from blood transfusion given for bleeding assoc with MVA injuries)  . Colon cancer Neg Hx   . Prostate cancer Neg Hx     SOCIAL HX:  Social History   Socioeconomic History  . Marital status: Married    Spouse name: Not on file  . Number of children: Not on file  . Years of education: Not on file  . Highest education level: Not on file  Occupational History  . Occupation: [employer] Use  . Smoking status: Never Smoker  . Smokeless tobacco:  Current User    Types: Snuff  Substance and Sexual Activity  . Alcohol use: Yes    Comment: 3 daily   . Drug use: No  . Sexual activity: Yes    Birth control/protection: Other-see comments    Comment: girlfriend uses birth control  Other Topics Concern  . Not on file  Social History Narrative   Married, one daughter.   Orig from [place], [place] from [employer].   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.   Never smoker.  Occ alcohol.  No hx of alc or drug problem.   4 caffeine drinks daily    Social Determinants of Health   Financial Resource Strain:   . Difficulty of Paying  Living Expenses:   Food Insecurity:   . Worried About Programme researcher, broadcasting/film/video in the Last Year:   . Barista in the Last Year:   Transportation Needs:   . Freight forwarder (Medical):   Marland Kitchen Lack of Transportation (Non-Medical):   Physical Activity:   . Days of Exercise per Week:   . Minutes of Exercise per Session:   Stress:   . Feeling of Stress :   Social Connections:   . Frequency of Communication with Friends and Family:   . Frequency of Social Gatherings with Friends and Family:   . Attends Religious Services:   . Active Member of Clubs or Organizations:   . Attends Banker Meetings:   Marland Kitchen Marital Status:       Current Outpatient Medications:  .  omeprazole (PRILOSEC) 40 MG capsule, Take 1 capsule (40 mg total) by mouth daily., Disp: 90 capsule, Rfl: 3 .  citalopram (CELEXA) 10 MG tablet, Take 1 tablet (10 mg total) by mouth daily. (Patient not taking: Reported on 08/10/2019), Disp: 30 tablet, Rfl: 0 .  FLUoxetine (PROZAC) 20 MG tablet, Take 1 tablet (20 mg total) by mouth daily. (Patient not taking: Reported on 08/10/2019), Disp: 30 tablet, Rfl: 0  EXAM:  VITALS per patient if applicable: There were no vitals taken for this visit.   GENERAL: alert, oriented, appears well and in no acute distress  HEENT: atraumatic, conjunttiva clear, no obvious abnormalities on inspection of external nose and ears  NECK: normal movements of the head and neck  LUNGS: on inspection no signs of respiratory distress, breathing rate appears normal, no obvious gross SOB, gasping or wheezing  CV: no obvious cyanosis  MS: moves all visible extremities without noticeable abnormality  PSYCH/NEURO: pleasant and cooperative, no obvious depression or anxiety, speech and thought processing grossly intact  ASSESSMENT AND PLAN:  Discussed the following assessment and plan:  1) Acute stress reaction, hx of panic attack. I don't think he actually has GAD. His coping with  increased stress the last 12-31mo is intermittently inadequate. Given his intolerance to two SSRIs and the hx of good response to prn use of valium low dose I will avoid daily SSRI tx and will do valium 2mg , 1-2 q12h prn, #60, RF x 1. Therapeutic expectations and side effect profile of medication discussed today.  Patient's questions answered. If it looks like this will be a med that we use past the next month or so then I'll have him sign CSC. Notified pt of this today and he expressed understanding.  I discussed the assessment and treatment plan with the patient. The patient was provided an opportunity to ask questions and all were answered. The patient agreed with the plan and demonstrated an  understanding of the instructions.   The patient was advised to call back or seek an in-person evaluation if the symptoms worsen or if the condition fails to improve as anticipated.  F/u: 1 mo f/u anxiety, virtual or in person->pt's choice  Signed:  Santiago Bumpers, MD           08/10/2019

## 2019-08-25 ENCOUNTER — Telehealth: Payer: Self-pay

## 2019-08-25 NOTE — Telephone Encounter (Signed)
Pt was called and appt was made for tomorrow as we have no visits today at our office or other [facility] sites. He was told he could go to urgent care if he needed to just let us know if he did. He wanted appt tomorrow with Dr Judie Petit

## 2019-08-25 NOTE — Telephone Encounter (Signed)
Patient has a severe gout flare up and is requesting to be seen today. He states he cannot even put on his boots for work.  Please call him at [phone].

## 2019-08-26 ENCOUNTER — Other Ambulatory Visit: Payer: Self-pay

## 2019-08-26 ENCOUNTER — Ambulatory Visit: Payer: BC Managed Care – PPO | Admitting: Family Medicine

## 2019-08-26 ENCOUNTER — Encounter: Payer: Self-pay | Admitting: Family Medicine

## 2019-08-26 VITALS — BP 148/100 | HR 85 | Temp 98.0°F | Resp 18 | Ht 72.0 in | Wt 226.6 lb

## 2019-08-26 DIAGNOSIS — M1A9XX0 Chronic gout, unspecified, without tophus (tophi): Secondary | ICD-10-CM | POA: Diagnosis not present

## 2019-08-26 DIAGNOSIS — M109 Gout, unspecified: Secondary | ICD-10-CM | POA: Diagnosis not present

## 2019-08-26 MED ORDER — ALLOPURINOL 100 MG PO TABS: 100.0000 mg | ORAL_TABLET | Freq: Every day | ORAL | 1 refills | Status: DC

## 2019-08-26 MED ORDER — METHYLPREDNISOLONE ACETATE 80 MG/ML IJ SUSP
80.0000 mg | Freq: Once | INTRAMUSCULAR | Status: AC
  Administered 2019-08-26: 80 mg via INTRAMUSCULAR

## 2019-08-26 MED ORDER — PREDNISONE 20 MG PO TABS: ORAL_TABLET | ORAL | 0 refills | Status: DC

## 2019-08-26 NOTE — Progress Notes (Signed)
OFFICE VISIT  08/26/2019   CC:  Chief Complaint  Patient presents with  . Gout    Pt states flare up has been going on for 2 weeks and is getting worse. Pt states left big toe and swelling of left ankle/leg.  . Follow-up   HPI:    Patient is a 34 y.o. Caucasian male who presents for "gout flare up". Onset about 2 wks ago L great toe pain, swelling, gradually worsening.   Ibuprofen not helping much.  No fevers.  Hurts bad w/out wt bearing but VERY bad with wt bearing/ambulating.   Hx of gout, usually MTP on either food, also L ankle back in 2019. First episode 06/2016, MTP, saw Dr. Claiborne Billings here.  Uric acid 8.4 at that time, same level 11/2017. Allopurinol rx'd back in 2018 but he never took this much, says he is not a medication type person. +Hx of steroid rx's for flares, but also has some flares that he does not come in for, just treats with advil and it resolves (mild ones).  ROS: no other joints bothering him.  No rash.  No HAs.  No myalgias.  Past Medical History:  Diagnosis Date  . Abscess    left buttock  . ALLERGIC RHINITIS 05/28/2007  . Anxiety    Prozac trial 06/2019-->intol.  Cital 10mg  trial 07/18/19.  07/20/19 Atypical chest pain 2020   11/2018 ED eval-->reassuring/normal.  . GERD (gastroesophageal reflux disease)   . Gout 2018   MTP.  L ankle.  2019 Headache(784.0)   . Hemorrhoid   . Pilonidal cyst   . Pneumonia 3-4 years ago    Past Surgical History:  Procedure Laterality Date  . FRACTURE SURGERY  2009   right wrist  . INGUINAL HERNIA REPAIR N/A 12/18/2015   Procedure: LAPAROSCOPIC BILATERAL INGUINAL HERNIA REPAIR;  Surgeon: 12/20/2015 Kinsinger, MD;  Location: [facility];  Service: General;  Laterality: N/A;  . INSERTION OF MESH N/A 12/18/2015   Procedure: INSERTION OF MESH;  Surgeon: 12/20/2015 Kinsinger, MD;  Location: [facility];  Service: General;  Laterality: N/A;  . PILONIDAL CYST EXCISION  12/30/10  . PILONIDAL CYST EXCISION  04/08/2011   Procedure: CYST EXCISION  PILONIDAL SIMPLE;  Surgeon: 04/10/2011. Wilmon Arms, MD;  Location: [facility];  Service: General;  Laterality: N/A;  Excision of chronic pilonidal abscess  . TONSILLECTOMY      Outpatient Medications Prior to Visit  Medication Sig Dispense Refill  . diazepam (VALIUM) 2 MG tablet 1-2 tabs po bid prn severe anxiety 60 tablet 0  . omeprazole (PRILOSEC) 40 MG capsule Take 1 capsule (40 mg total) by mouth daily. 90 capsule 3  . citalopram (CELEXA) 10 MG tablet Take 1 tablet (10 mg total) by mouth daily. (Patient not taking: Reported on 08/10/2019) 30 tablet 0  . FLUoxetine (PROZAC) 20 MG tablet Take 1 tablet (20 mg total) by mouth daily. (Patient not taking: Reported on 08/10/2019) 30 tablet 0   No facility-administered medications prior to visit.    Allergies  Allergen Reactions  . Penicillins Anaphylaxis    Of throat Has patient had a PCN reaction causing immediate rash, facial/tongue/throat swelling, SOB or lightheadedness with hypotension: yes Has patient had a PCN reaction causing severe rash involving mucus membranes or skin necrosis: yes Has patient had a PCN reaction that required hospitalization yes Has patient had a PCN reaction occurring within the last 10 years: yes If all of the above answers are "NO", then may proceed with Cephalosporin use.  08/12/2019  Clarithromycin     REACTION: SOB, Palpitations  . Sulfamethoxazole-Trimethoprim     Patient unsure of reaction.  . Fluoxetine Nausea Only    Nausea and dizziness  . Levaquin [Levofloxacin] Other (See Comments)    dizzy    ROS As per HPI  PE: Blood pressure (!) 148/100, pulse 85, temperature 98 F (36.7 C), temperature source Temporal, resp. rate 18, height 6' (1.829 m), weight 226 lb 9.6 oz (102.8 kg), SpO2 98 %. Gen: Alert, well appearing.  Patient is oriented to person, place, time, and situation. AFFECT: pleasant, lucid thought and speech. L great toe MTP with generalized soft tissue swelling, mild pinkish discoloration lateral aspect  of 1st MTP, definite TTP all over MTP joint, dec ROM of MTP jt.  LABS:   Lab Results  Component Value Date   TSH 1.12 10/18/2013   Lab Results  Component Value Date   WBC 9.8 10/26/2017   HGB 15.1 10/26/2017   HCT 44.7 10/26/2017   MCV 90.0 10/26/2017   PLT 231.0 10/26/2017   Lab Results  Component Value Date   CREATININE 1.10 10/26/2017   BUN 10 10/26/2017   NA 138 10/26/2017   K 4.6 10/26/2017   CL 102 10/26/2017   CO2 30 10/26/2017   Lab Results  Component Value Date   ALT 24 10/26/2017   AST 17 10/26/2017   ALKPHOS 55 10/26/2017   BILITOT 0.4 10/26/2017   Lab Results  Component Value Date   LABURIC 8.4 (H) 10/26/2017   IMPRESSION AND PLAN:  Acute L podagra, recurrent gouty arthritis. We'll try getting allopurinol on board again: 100mg  qd and I reiterated the need to take this every day to prevent flares.  I have a baseline uric acid from 2018/2019 and I'll recheck this in 1 mo to see if allopurinol needs to be increased--goal uric acid level of <6. Today will give 80mg  depo medrol here, and starting tomorrow take prednisone 40mg  qd x 5d, then 20 mg qd x 5d.  Low purine diet handout reviewed and given to patient today. Stop NSAIDs.  Take tylenol 1000 mg qid prn pain.  An After Visit Summary was printed and given to the patient.  FOLLOW UP: Return in about 4 weeks (around 09/23/2019) for f/u gout.  Signed:  Crissie Sickles, MD           08/26/2019

## 2019-08-26 NOTE — Patient Instructions (Signed)

## 2019-09-12 ENCOUNTER — Encounter: Payer: Self-pay | Admitting: Family Medicine

## 2019-09-12 MED ORDER — ALLOPURINOL 100 MG PO TABS: ORAL_TABLET | ORAL | 0 refills | Status: DC

## 2019-09-12 MED ORDER — COLCHICINE 0.6 MG PO TABS
0.6000 mg | ORAL_TABLET | Freq: Two times a day (BID) | ORAL | 0 refills | Status: DC
Start: 2019-09-12 — End: 2020-02-22

## 2019-09-12 NOTE — Telephone Encounter (Signed)
Tell him I do understand his question and I still don't want him to stop the allopurinol. He has been on this med long enough that it is not going to  a flare up of his gout. Reassure him. Take the increased dose of allopurinol and colchicine as I instructed. --thx

## 2019-09-12 NOTE — Telephone Encounter (Signed)
Patient was last seen for this issue on 08/26/19 and advised to take Allopurinol for daily prevention. Please advise, thanks.

## 2019-09-12 NOTE — Telephone Encounter (Signed)
I do NOT want him to stop allopurinol. In fact I want him to increase his dose to TWO tabs every day (I'll send in new rx for him to fill when he runs out of current allopurinol supply). Also, I'm going to start him on colchicine twice per day until I see him again (needs appt in 10-14d) for this problem. No more prednisone at this time.-thx

## 2019-09-12 NOTE — Telephone Encounter (Signed)
Patient is still needing clarification on medication/recommendations. I did not see anything mentioned in 5/7 office note. Did you verbally tell him anything?

## 2020-02-21 ENCOUNTER — Encounter: Payer: Self-pay | Admitting: Family Medicine

## 2020-02-21 NOTE — Telephone Encounter (Signed)
Please advise on message below. Soonest available is 11/4 @ 9:30. There is a hold on current time slot

## 2020-02-22 ENCOUNTER — Telehealth (INDEPENDENT_AMBULATORY_CARE_PROVIDER_SITE_OTHER): Payer: BC Managed Care – PPO | Admitting: Family Medicine

## 2020-02-22 ENCOUNTER — Encounter: Payer: Self-pay | Admitting: Family Medicine

## 2020-02-22 VITALS — Wt 215.0 lb

## 2020-02-22 DIAGNOSIS — M10072 Idiopathic gout, left ankle and foot: Secondary | ICD-10-CM

## 2020-02-22 DIAGNOSIS — M10079 Idiopathic gout, unspecified ankle and foot: Secondary | ICD-10-CM

## 2020-02-22 MED ORDER — PREDNISONE 10 MG PO TABS
ORAL_TABLET | ORAL | 0 refills | Status: DC
Start: 2020-02-22 — End: 2020-06-18

## 2020-02-22 NOTE — Telephone Encounter (Signed)
Pt is being scheduled for 4 pm VV. Could not do in person OV.

## 2020-02-22 NOTE — Telephone Encounter (Signed)
Can you get him on at 4 oclock today (in person preferred but virtual ok). Otherwise 11/4 at 9:30. Let me know.

## 2020-02-22 NOTE — Progress Notes (Signed)
Virtual Visit via Video Note  I connected with pt  on 02/22/20 at  4:00 PM EDT by a video enabled telemedicine application and verified that I am speaking with the correct person using two identifiers.  Location patient: home Location provider:work or home office Persons participating in the virtual visit: patient, provider  I discussed the limitations of evaluation and management by telemedicine and the availability of in person appointments. The patient expressed understanding and agreed to proceed.  HPI: 34 y/o WM being seen today for left great toe MTP joint pain. I saw him last on 08/26/19. A/P as of that visit: "Acute L podagra, recurrent gouty arthritis. We'll try getting allopurinol on board again: 100mg  qd and I reiterated the need to take this every day to prevent flares.  I have a baseline uric acid from 2018/2019 and I'll recheck this in 1 mo to see if allopurinol needs to be increased--goal uric acid level of <6. Today will give 80mg  depo medrol here, and starting tomorrow take prednisone 40mg  qd x 5d, then 20 mg qd x 5d.  Low purine diet handout reviewed and given to patient today. Stop NSAIDs.  Take tylenol 1000 mg qid prn pain."  INTERIM HX: He did not come in for recheck of uric acid after last visit. Onset of pain 2-3 wks ago, L MTP, some mild redness and swelling, mild/mod intensity. Walking with limp some, advil helpful but pain just lingering. Diet is better, has lost some wt. Took allopurinol after last visit but after 70mo ran out and I had not put RF's on rx because I had plans to possibly change dose after getting uric acid level rechecked---but he did not come in for lab visit to recheck it. Sound like he restarted some old allopurinol he had from a couple years ago--about 2-3 wks ago--takes 1 bid b/c both at same time cause him to feel drowsy.  No other joints bothering him. No f/c/malaise.   ROS: See pertinent positives and negatives per HPI.  Past Medical  History:  Diagnosis Date  . Abscess    left buttock  . ALLERGIC RHINITIS 05/28/2007  . Anxiety    Prozac trial 06/2019-->intol.  Cital 10mg  trial 07/18/19.  3mo Atypical chest pain 2020   11/2018 ED eval-->reassuring/normal.  . GERD (gastroesophageal reflux disease)   . Gout 2018   MTP.  L ankle.  Marland Kitchen Headache(784.0)   . Hemorrhoid   . Pilonidal cyst   . Pneumonia 3-4 years ago    Past Surgical History:  Procedure Laterality Date  . FRACTURE SURGERY  2009   right wrist  . INGUINAL HERNIA REPAIR N/A 12/18/2015   Procedure: LAPAROSCOPIC BILATERAL INGUINAL HERNIA REPAIR;  Surgeon: 2019 Kinsinger, MD;  Location: [facility];  Service: General;  Laterality: N/A;  . INSERTION OF MESH N/A 12/18/2015   Procedure: INSERTION OF MESH;  Surgeon: 2010 Kinsinger, MD;  Location: [facility];  Service: General;  Laterality: N/A;  . PILONIDAL CYST EXCISION  12/30/10  . PILONIDAL CYST EXCISION  04/08/2011   Procedure: CYST EXCISION PILONIDAL SIMPLE;  Surgeon: 12/20/2015. De Blanch, MD;  Location: [facility];  Service: General;  Laterality: N/A;  Excision of chronic pilonidal abscess  . TONSILLECTOMY       Current Outpatient Medications:  .  allopurinol (ZYLOPRIM) 100 MG tablet, 2 tabs po qd, Disp: 60 tablet, Rfl: 0 .  citalopram (CELEXA) 10 MG tablet, Take 1 tablet (10 mg total) by mouth daily. (Patient not taking: Reported on 08/10/2019),  Disp: 30 tablet, Rfl: 0 .  colchicine 0.6 MG tablet, Take 1 tablet (0.6 mg total) by mouth 2 (two) times daily., Disp: 30 tablet, Rfl: 0 .  diazepam (VALIUM) 2 MG tablet, 1-2 tabs po bid prn severe anxiety, Disp: 60 tablet, Rfl: 0 .  FLUoxetine (PROZAC) 20 MG tablet, Take 1 tablet (20 mg total) by mouth daily. (Patient not taking: Reported on 08/10/2019), Disp: 30 tablet, Rfl: 0 .  omeprazole (PRILOSEC) 40 MG capsule, Take 1 capsule (40 mg total) by mouth daily., Disp: 90 capsule, Rfl: 3 .  predniSONE (DELTASONE) 20 MG tablet, 2 tabs po qd x 5d, then 1 tab po qd x 5d, Disp: 15  tablet, Rfl: 0  EXAM:  VITALS per patient if applicable:  Vitals with BMI 08/26/2019 07/13/2019 03/22/2018  Height 6\' 0"  6\' 0"  6\' 0"   Weight 226 lbs 10 oz 227 lbs 3 oz 222 lbs  BMI 30.73 30.81 30.1  Systolic 148 129  Diastolic 100 86 79  Pulse 85 54 66     GENERAL: alert, oriented, appears well and in no acute distress  HEENT: atraumatic, conjunttiva clear, no obvious abnormalities on inspection of external nose and ears  NECK: normal movements of the head and neck  LUNGS: on inspection no signs of respiratory distress, breathing rate appears normal, no obvious gross SOB, gasping or wheezing  CV: no obvious cyanosis  MS: moves all visible extremities without noticeable abnormality  PSYCH/NEURO: pleasant and cooperative, no obvious depression or anxiety, speech and thought processing grossly intact  LABS: none today  Lab Results  Component Value Date   LABURIC 8.4 (H) 10/26/2017     Chemistry      Component Value Date/Time   NA 138 10/26/2017 1444   K 4.6 10/26/2017 1444   CL 102 10/26/2017 1444   CO2 30 10/26/2017 1444   BUN 10 10/26/2017 1444   CREATININE 1.10 10/26/2017 1444   CREATININE 0.92 08/04/2014 1540      Component Value Date/Time   CALCIUM 9.3 10/26/2017 1444   ALKPHOS 55 10/26/2017 1444   AST 17 10/26/2017 1444   ALT 24 10/26/2017 1444   BILITOT 0.4 10/26/2017 1444     ASSESSMENT AND PLAN:  Discussed the following assessment and plan:  1) Gouty arthritis, L MTP, recurrent. He is not a guy who likes to take meds. We'll try again to get him consistently taking allopurinol: cont 200 mg qd that he has recently restarted a couple weeks ago. Check uric acid level in 2 wks, goal uric acid level is 6. Rx'd prednisone 10mg  tabs today: 3 tabs qd x 4, 2 tabs qd x 4, 1 tab qd x 4. Of note, colchicine caused GI upset so I took this off his med list today.  -we discussed possible serious and likely etiologies, options for evaluation and workup,  limitations of telemedicine visit vs in person visit, treatment, treatment risks and precautions. Pt prefers to treat via telemedicine empirically rather than in person at this moment.    I discussed the assessment and treatment plan with the patient. The patient was provided an opportunity to ask questions and all were answered. The patient agreed with the plan and demonstrated an understanding of the instructions.   F/u: lab 2 wks  Signed:  12/27/2017, MD           02/22/2020

## 2020-03-27 ENCOUNTER — Other Ambulatory Visit: Payer: Self-pay | Admitting: Family Medicine

## 2020-05-09 ENCOUNTER — Other Ambulatory Visit: Payer: Self-pay | Admitting: Family Medicine

## 2020-05-10 NOTE — Telephone Encounter (Signed)
I sent in #15 valium---needs o/v in the next 2 mo or so to f/u anxiety and gout-thx

## 2020-05-10 NOTE — Telephone Encounter (Signed)
RF request for valium LOV:02/22/20 Next ov: n/a Last written: 08/10/19 (60,0)  Please advise, medication pending

## 2020-05-10 NOTE — Telephone Encounter (Signed)
Attempted to call pt and unable to LVM, busy

## 2020-06-14 ENCOUNTER — Other Ambulatory Visit: Payer: Self-pay | Admitting: Family Medicine

## 2020-06-14 NOTE — Telephone Encounter (Signed)
Spoke with pt and advised f/u appt needed before further refills. He stated he has followed up since 07/13/19 appt, last seen 07/2019 but was due to f/u 1 month again which did not occur. Offered to schedule appt, pt agreed. Virtual appt scheduled for 2/28

## 2020-06-14 NOTE — Telephone Encounter (Signed)
Requesting: Diazepam 2mg  Contract: n/a UDS:n/a Last Visit:02/22/20 VV acute, 07/13/19 GAD f/u Next Visit: advised to f/u 4 weeks from 07/13/19 appt Last Refill:05/10/20 (15,0)  Please Advise. Medication pending. Mychart message advising to schedule appt

## 2020-06-14 NOTE — Telephone Encounter (Signed)
RF denied b/c has not followed up appropriately.

## 2020-06-18 ENCOUNTER — Telehealth (INDEPENDENT_AMBULATORY_CARE_PROVIDER_SITE_OTHER): Payer: BC Managed Care – PPO | Admitting: Family Medicine

## 2020-06-18 ENCOUNTER — Encounter: Payer: Self-pay | Admitting: Family Medicine

## 2020-06-18 DIAGNOSIS — F4322 Adjustment disorder with anxiety: Secondary | ICD-10-CM

## 2020-06-18 MED ORDER — DIAZEPAM 2 MG PO TABS: ORAL_TABLET | ORAL | 5 refills | Status: DC

## 2020-06-18 NOTE — Progress Notes (Signed)
Virtual Visit via Video Note  I connected with Devin Burton on 06/18/20 at  2:30 PM EST by a video enabled telemedicine application and verified that I am speaking with the correct person using two identifiers.  Location patient: home, [place] Location provider:work or home office Persons participating in the virtual visit: patient, provider  I discussed the limitations of evaluation and management by telemedicine and the availability of in person appointments. The patient expressed understanding and agreed to proceed.   HPI: 35 y/o WM being seen today for f/u chronic anxiety with hx of panic attacks.  Of note, I last saw him 4 mo ago but that was for gout. A/P as of that visit: "Gouty arthritis, L MTP, recurrent. He is not a guy who likes to take meds. We'll try again to get him consistently taking allopurinol: cont 200 mg qd that he has recently restarted a couple weeks ago. Check uric acid level in 2 wks, goal uric acid level is 6. Rx'd prednisone 10mg  tabs today: 3 tabs qd x 4, 2 tabs qd x 4, 1 tab qd x 4. Of note, colchicine caused GI upset so I took this off his med list today."  INTERIM HX: Anxiety: intol of fluoxetine and citalopram.  Has been treating with prn diazepam 2mg . Started new position at job about 2 mo ago, had to start taking diaz more often soon after this b/c inc stress.  Lost his grandmother a few weeks ago and it has caused signif inc in anxiety level, sleep is poor.  Irritable, restless, poor energy level.   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  Ran out of diaz 2mg  a few weeks ago.  States he is taking the allopurinol I rx'd him 03/27/20 for gout prophylaxis. However, it doesn't look like the rx I gave at that time (#60) had any RF's.  PMP AWARE reviewed today: most recent rx for diazepam 2mg  was filled 05/10/20, # 15, rx by me.  Prior to that the last rx for this med was filled 08/10/19, #60 rx by me. No red flags.   ROS: See pertinent positives and  negatives per HPI.  Past Medical History:  Diagnosis Date  . Abscess    left buttock  . ALLERGIC RHINITIS 05/28/2007  . Anxiety    Prozac trial 06/2019-->intol.  Cital 10mg  trial 07/18/19.  05/12/20 Atypical chest pain 2020   11/2018 ED eval-->reassuring/normal.  . GERD (gastroesophageal reflux disease)   . Gout 2018   MTP.  L ankle.  07/20/19 Headache(784.0)   . Hemorrhoid   . Pilonidal cyst   . Pneumonia 3-4 years ago    Past Surgical History:  Procedure Laterality Date  . FRACTURE SURGERY  2009   right wrist  . INGUINAL HERNIA REPAIR N/A 12/18/2015   Procedure: LAPAROSCOPIC BILATERAL INGUINAL HERNIA REPAIR;  Surgeon: 12/2018 Kinsinger, MD;  Location: [facility];  Service: General;  Laterality: N/A;  . INSERTION OF MESH N/A 12/18/2015   Procedure: INSERTION OF MESH;  Surgeon: Marland Kitchen Kinsinger, MD;  Location: [facility];  Service: General;  Laterality: N/A;  . PILONIDAL CYST EXCISION  12/30/10  . PILONIDAL CYST EXCISION  04/08/2011   Procedure: CYST EXCISION PILONIDAL SIMPLE;  Surgeon: De Blanch. 12/20/2015, MD;  Location: [facility];  Service: General;  Laterality: N/A;  Excision of chronic pilonidal abscess  . TONSILLECTOMY       Current Outpatient Medications:  .  allopurinol (ZYLOPRIM) 100 MG tablet, TAKE TWO TABLETS BY  MOUTH EVERY DAY, Disp: 60 tablet, Rfl: 0 .  diazepam (VALIUM) 2 MG tablet, TAKE 1-2 TABLETS BY MOUTH TWICE DAILY AS NEEDED FOR SEVERE ANXIETY, Disp: 15 tablet, Rfl: 0 .  FLUoxetine (PROZAC) 20 MG tablet, Take 1 tablet (20 mg total) by mouth daily. (Patient not taking: Reported on 08/10/2019), Disp: 30 tablet, Rfl: 0 .  omeprazole (PRILOSEC) 40 MG capsule, Take 1 capsule (40 mg total) by mouth daily., Disp: 90 capsule, Rfl: 3 .  predniSONE (DELTASONE) 10 MG tablet, 3 tabs po qd x 4 days, then 2 tabs po qd x 4 days, then 1 tab po qd x 4d, Disp: 24 tablet, Rfl: 0  EXAM:  VITALS per patient if applicable:  Vitals with BMI 02/22/2020 08/26/2019 07/13/2019  Height - 6\' 0"  6\' 0"   Weight 215  lbs 226 lbs 10 oz 227 lbs 3 oz  BMI - 30.73 30.81  Systolic - 148 129  Diastolic - 100 86  Pulse - 85 54     GENERAL: alert, oriented, appears well and in no acute distress  HEENT: atraumatic, conjunttiva clear, no obvious abnormalities on inspection of external nose and ears  NECK: normal movements of the head and neck  LUNGS: on inspection no signs of respiratory distress, breathing rate appears normal, no obvious gross SOB, gasping or wheezing  CV: no obvious cyanosis  MS: moves all visible extremities without noticeable abnormality  PSYCH/NEURO: pleasant and cooperative, no obvious depression or anxiety, speech and thought processing grossly intact  LABS: none today   Chemistry      Component Value Date/Time   NA 138 10/26/2017 1444   K 4.6 10/26/2017 1444   CL 102 10/26/2017 1444   CO2 30 10/26/2017 1444   BUN 10 10/26/2017 1444   CREATININE 1.10 10/26/2017 1444   CREATININE 0.92 08/04/2014 1540      Component Value Date/Time   CALCIUM 9.3 10/26/2017 1444   ALKPHOS 55 10/26/2017 1444   AST 17 10/26/2017 1444   ALT 24 10/26/2017 1444   BILITOT 0.4 10/26/2017 1444     Lab Results  Component Value Date   LABURIC 8.4 (H) 10/26/2017    ASSESSMENT AND PLAN:  Discussed the following assessment and plan:  1) GAD, adjustment d/o with anxious mood, some grief rxn due to loss of his GM playing a role as well. Intol of fluox (dizziness, nausea) and citalopram (unknown intol) and doesn't favor trial of another antidepressant at this time.   He has taken low dose diaz appropriately/responsibly and with good effect in the past. Restart 2mg  diazepam bid prn.  2) Gout: Cont allopurinol 200mg  qd b/c seems to be helping well for gout prevention. States he has not had any gout flares since starting the med a few months ago.  I discussed the assessment and treatment plan with the patient. The patient was provided an opportunity to ask questions and all were answered. The  patient agreed with the plan and demonstrated an understanding of the instructions.   F/u: 6 mo in office, needs CSC and UDS at that time.  Signed:  12/27/2017, MD           06/18/2020

## 2020-11-06 ENCOUNTER — Other Ambulatory Visit (HOSPITAL_COMMUNITY): Payer: Self-pay | Admitting: Orthopaedic Surgery

## 2020-11-06 ENCOUNTER — Ambulatory Visit (HOSPITAL_COMMUNITY)
Admission: RE | Admit: 2020-11-06 | Discharge: 2020-11-06 | Disposition: A | Discharge status: Home / Self Care | Payer: BC Managed Care – PPO | Source: Ambulatory Visit | Attending: Internal Medicine | Admitting: Internal Medicine

## 2020-11-06 ENCOUNTER — Other Ambulatory Visit: Payer: Self-pay

## 2020-11-06 DIAGNOSIS — M79604 Pain in right leg: Secondary | ICD-10-CM

## 2020-11-06 DIAGNOSIS — M7989 Other specified soft tissue disorders: Secondary | ICD-10-CM | POA: Insufficient documentation

## 2020-12-17 ENCOUNTER — Encounter: Payer: Self-pay | Admitting: Family Medicine

## 2020-12-17 ENCOUNTER — Ambulatory Visit: Payer: BC Managed Care – PPO | Admitting: Family Medicine

## 2020-12-17 ENCOUNTER — Other Ambulatory Visit: Payer: Self-pay

## 2020-12-17 VITALS — BP 107/69 | HR 59 | Temp 97.5°F | Resp 16 | Ht 72.0 in | Wt 231.2 lb

## 2020-12-17 DIAGNOSIS — R5381 Other malaise: Secondary | ICD-10-CM

## 2020-12-17 DIAGNOSIS — R21 Rash and other nonspecific skin eruption: Secondary | ICD-10-CM | POA: Diagnosis not present

## 2020-12-17 DIAGNOSIS — R5383 Other fatigue: Secondary | ICD-10-CM

## 2020-12-17 DIAGNOSIS — R42 Dizziness and giddiness: Secondary | ICD-10-CM | POA: Diagnosis not present

## 2020-12-17 DIAGNOSIS — R519 Headache, unspecified: Secondary | ICD-10-CM | POA: Diagnosis not present

## 2020-12-17 DIAGNOSIS — R5081 Fever presenting with conditions classified elsewhere: Secondary | ICD-10-CM | POA: Diagnosis not present

## 2020-12-17 DIAGNOSIS — M1000 Idiopathic gout, unspecified site: Secondary | ICD-10-CM

## 2020-12-17 DIAGNOSIS — F411 Generalized anxiety disorder: Secondary | ICD-10-CM

## 2020-12-17 DIAGNOSIS — M791 Myalgia, unspecified site: Secondary | ICD-10-CM

## 2020-12-17 MED ORDER — DOXYCYCLINE HYCLATE 100 MG PO CAPS: 100.0000 mg | ORAL_CAPSULE | Freq: Two times a day (BID) | ORAL | 0 refills | Status: AC

## 2020-12-17 MED ORDER — OMEPRAZOLE 40 MG PO CPDR: 40.0000 mg | DELAYED_RELEASE_CAPSULE | Freq: Every day | ORAL | 3 refills | Status: AC

## 2020-12-17 MED ORDER — DIAZEPAM 2 MG PO TABS: ORAL_TABLET | ORAL | 5 refills | Status: DC

## 2020-12-17 NOTE — Progress Notes (Signed)
OFFICE VISIT  12/17/2020  CC:  Chief Complaint  Patient presents with   Follow-up    Anxiety, gout   HPI:    Patient is a 35 y.o. Caucasian male who presents for 6 mo f/u anxiety and gout. A/P as of last visit: "1) GAD, adjustment d/o with anxious mood, some grief rxn due to loss of his GM playing a role as well. Intol of fluox (dizziness, nausea) and citalopram (unknown intol) and doesn't favor trial of another antidepressant at this time.   He has taken low dose diaz appropriately/responsibly and with good effect in the past. Restart $RemoveBefor'2mg'PTjNJhPKkaul$  diazepam bid prn.   2) Gout: Cont allopurinol $RemoveBeforeDE'200mg'xBPwqqAbVcVFDtQ$  qd b/c seems to be helping well for gout prevention. States he has not had any gout flares since starting the med a few months ago."  INTERIM HX: Stable anxiety level for the most part--good days and bad days.  He reports many symptoms today: Onset of HA about 10 d/a. Followed by signif soreness/tenderness of occipital muscles area on R. Since then he describes intermittent feeling of nausea, dizziness, bad fatigue, generalized body aches, some fever to 101 for 1/2 a day or so, broke out in a rash all over (denies hives, vesicles, or itching).  Seemed to resolve with cool shower.  Some diarrhea for a 3 hr period but it was after he ate [place] food.  Poor appetite, has lost several pounds. Since the initial HA he has had some milder tension type headaches but no other severe HAs, has had some mild soreness in occipital muscles regions.  No abd pain, no CP, no SOB, no joint swelling, no cough, no ST, no vision c/o, no vertigo.   PMP AWARE reviewed today: most recent rx for diazepam $RemoveBefo'2mg'LEhZiAIsrDm$  was filled 11/19/20, # 29, rx by me. No red flags.  Says he's only taking his allopurinol every 4-5 days, otherwise it makes him feel "weird". Only one episode of gout in R MTP since I saw him last, mild.  [facility] for ankle issues. Had some pain/swelling of R LL and they checked LE venous doppler and it was  NEG for clot or cyst.  Past Medical History:  Diagnosis Date   Abscess    left buttock   ALLERGIC RHINITIS 05/28/2007   Anxiety    Prozac trial 06/2019-->intol.  Cital $Remo'10mg'EnKmP$  trial 07/18/19.   Atypical chest pain 2020   11/2018 ED eval-->reassuring/normal.   GERD (gastroesophageal reflux disease)    Gout 2018   MTP.  L ankle.   Headache(784.0)    Hemorrhoid    Pilonidal cyst    Pneumonia 3-4 years ago    Past Surgical History:  Procedure Laterality Date   FRACTURE SURGERY  2009   right wrist   INGUINAL HERNIA REPAIR N/A 12/18/2015   Procedure: LAPAROSCOPIC BILATERAL INGUINAL HERNIA REPAIR;  Surgeon: Arta Bruce Kinsinger, MD;  Location: [facility];  Service: General;  Laterality: N/A;   INSERTION OF MESH N/A 12/18/2015   Procedure: INSERTION OF MESH;  Surgeon: Arta Bruce Kinsinger, MD;  Location: [facility];  Service: General;  Laterality: N/A;   PILONIDAL CYST EXCISION  12/30/10   PILONIDAL CYST EXCISION  04/08/2011   Procedure: CYST EXCISION PILONIDAL SIMPLE;  Surgeon: Imogene Burn. Georgette Dover, MD;  Location: [facility];  Service: General;  Laterality: N/A;  Excision of chronic pilonidal abscess   TONSILLECTOMY      Outpatient Medications Prior to Visit  Medication Sig Dispense Refill   allopurinol (ZYLOPRIM) 100 MG tablet TAKE TWO  TABLETS BY MOUTH EVERY DAY 60 tablet 0   diazepam (VALIUM) 2 MG tablet TAKE 1-2 TABLETS BY MOUTH TWICE DAILY AS NEEDED FOR SEVERE ANXIETY 60 tablet 5   omeprazole (PRILOSEC) 40 MG capsule Take 1 capsule (40 mg total) by mouth daily. 90 capsule 3   No facility-administered medications prior to visit.    Allergies  Allergen Reactions   Penicillins Anaphylaxis    Of throat Has patient had a PCN reaction causing immediate rash, facial/tongue/throat swelling, SOB or lightheadedness with hypotension: yes Has patient had a PCN reaction causing severe rash involving mucus membranes or skin necrosis: yes Has patient had a PCN reaction that required hospitalization yes Has  patient had a PCN reaction occurring within the last 10 years: yes If all of the above answers are "NO", then may proceed with Cephalosporin use.   Clarithromycin     REACTION: SOB, Palpitations   Sulfamethoxazole-Trimethoprim     Patient unsure of reaction.   Fluoxetine Nausea Only    Nausea and dizziness   Levaquin [Levofloxacin] Other (See Comments)    dizzy    ROS As per HPI  PE: Vitals with BMI 12/17/2020 02/22/2020 08/26/2019  Height $Remov'6\' 0"'dPbPwz$  - $'6\' 0"'u$   Weight 231 lbs 3 oz 215 lbs 226 lbs 10 oz  BMI 80.03 - 49.17  Systolic 915 - 056  Diastolic 69 - 979  Pulse 59 - 85   Gen: Alert, well appearing.  Patient is oriented to person, place, time, and situation. AFFECT: pleasant, lucid thought and speech. YIA:XKPV: no injection, icteris, swelling, or exudate.  EOMI, PERRLA. Mouth: lips without lesion/swelling.  Oral mucosa pink and moist. Oropharynx without erythema, exudate, or swelling.  Neck: no TM, LAD, or tenderness.   CV: RRR, no m/r/g.   LUNGS: CTA bilat, nonlabored resps, good aeration in all lung fields. ABD: soft, NT, ND, BS normal.  No hepatospenomegaly or mass.  No bruits. EXT: no clubbing or cyanosis.  no edema.  SKIN: no rash or jaundice No joint swelling. Neuro: CN 2-12 intact bilaterally, strength 5/5 in proximal and distal upper extremities and lower extremities bilaterally.  No sensory deficits.  No tremor  No ataxia.    LABS:  Lab Results  Component Value Date   LABURIC 8.4 (H) 10/26/2017     Chemistry      Component Value Date/Time   NA 138 10/26/2017 1444   K 4.6 10/26/2017 1444   CL 102 10/26/2017 1444   CO2 30 10/26/2017 1444   BUN 10 10/26/2017 1444   CREATININE 1.10 10/26/2017 1444   CREATININE 0.92 08/04/2014 1540      Component Value Date/Time   CALCIUM 9.3 10/26/2017 1444   ALKPHOS 55 10/26/2017 1444   AST 17 10/26/2017 1444   ALT 24 10/26/2017 1444   BILITOT 0.4 10/26/2017 1444     IMPRESSION AND PLAN:  1) Recent constellation of  symptoms worrisome for tick borne illness. He has many symptoms and it is hard to unify them all into one etiology. Will rx doxy 100 bid x 14d and check some labs-->cbc, cmet, tsh, esr, crp, cpk, RMSF abs, lyme panel, parvo ab's, EBV panel, HIV, and hepatitis serology.  2) GAD: intol of fluox (dizziness, nausea) and citalopram (unknown intol) and doesn't favor trial of another antidepressant at this time.   He has taken low dose diaz appropriately/responsibly and with good effect in the past. Continue $RemoveBefore'2mg'jESYrcgnWPwoG$  diazepam bid prn, #60, RF x 5.  3) Gout: doing well but only tolerates  allopurinol q4-5d.  An After Visit Summary was printed and given to the patient.  FOLLOW UP: Return in about 2 weeks (around 12/31/2020) for f/u multiple symptoms.  Signed:  Crissie Sickles, MD           12/17/2020

## 2020-12-18 LAB — CBC WITH DIFFERENTIAL/PLATELET
Basophils Absolute: 0.1 10*3/uL (ref 0.0–0.1)
Basophils Relative: 0.9 % (ref 0.0–3.0)
Eosinophils Absolute: 0.4 10*3/uL (ref 0.0–0.7)
Eosinophils Relative: 5.1 % — ABNORMAL HIGH (ref 0.0–5.0)
HCT: 43 % (ref 39.0–52.0)
Hemoglobin: 14 g/dL (ref 13.0–17.0)
Lymphocytes Relative: 41.9 % (ref 12.0–46.0)
Lymphs Abs: 2.9 10*3/uL (ref 0.7–4.0)
MCHC: 32.6 g/dL (ref 30.0–36.0)
MCV: 90.8 fl (ref 78.0–100.0)
Monocytes Absolute: 0.6 10*3/uL (ref 0.1–1.0)
Monocytes Relative: 8.8 % (ref 3.0–12.0)
Neutro Abs: 3 10*3/uL (ref 1.4–7.7)
Neutrophils Relative %: 43.3 % (ref 43.0–77.0)
Platelets: 255 10*3/uL (ref 150.0–400.0)
RBC: 4.74 Mil/uL (ref 4.22–5.81)
RDW: 12.7 % (ref 11.5–15.5)
WBC: 6.9 10*3/uL (ref 4.0–10.5)

## 2020-12-18 LAB — TSH: TSH: 1.93 u[IU]/mL (ref 0.35–5.50)

## 2020-12-18 LAB — COMPREHENSIVE METABOLIC PANEL
ALT: 45 U/L (ref 0–53)
AST: 24 U/L (ref 0–37)
Albumin: 4 g/dL (ref 3.5–5.2)
Alkaline Phosphatase: 63 U/L (ref 39–117)
BUN: 11 mg/dL (ref 6–23)
CO2: 29 mEq/L (ref 19–32)
Calcium: 9 mg/dL (ref 8.4–10.5)
Chloride: 102 mEq/L (ref 96–112)
Creatinine, Ser: 1.02 mg/dL (ref 0.40–1.50)
GFR: 96.78 mL/min (ref >60.00)
Glucose, Bld: 83 mg/dL (ref 70–99)
Potassium: 4.5 mEq/L (ref 3.5–5.1)
Sodium: 138 mEq/L (ref 135–145)
Total Bilirubin: 0.4 mg/dL (ref 0.2–1.2)
Total Protein: 6.9 g/dL (ref 6.0–8.3)

## 2020-12-18 LAB — SEDIMENTATION RATE: Sed Rate: 8 mm/hr (ref 0–15)

## 2020-12-18 LAB — C-REACTIVE PROTEIN: CRP: 1.3 mg/dL (ref 0.5–20.0)

## 2020-12-18 LAB — CK: Total CK: 94 U/L (ref 44–196)

## 2020-12-19 ENCOUNTER — Encounter: Payer: Self-pay | Admitting: Family Medicine

## 2020-12-19 LAB — EPSTEIN-BARR VIRUS (EBV) ANTIBODY PROFILE
EBV NA IgG: 306 U/mL — ABNORMAL HIGH (ref 0.0–17.9)
EBV VCA IgG: >600 U/mL — ABNORMAL HIGH (ref 0.0–17.9)
EBV VCA IgM: <36 U/mL (ref 0.0–35.9)

## 2020-12-19 LAB — LYME DISEASE SEROLOGY W/REFLEX: Lyme Total Antibody EIA: NEGATIVE

## 2020-12-19 NOTE — Telephone Encounter (Signed)
OK. I sent a result note to team Raiana Pharris

## 2020-12-28 LAB — ROCKY MOUNTAIN IGM TITER: RMSF IgM Titer: 1:512 {titer} — ABNORMAL HIGH

## 2020-12-28 LAB — PARVOVIRUS B19 ANTIBODY, IGG AND IGM
Parvovirus B19 IgG: 3.7 — ABNORMAL HIGH (ref <0.9)
Parvovirus B19 IgM: 0 (ref <0.9)

## 2020-12-28 LAB — ROCKY MTN SPOTTED FVR ABS PNL(IGG+IGM)
RMSF IgG: DETECTED — AB
RMSF IgM: DETECTED — AB

## 2020-12-28 LAB — HEPATITIS C ANTIBODY
Hepatitis C Ab: NONREACTIVE
SIGNAL TO CUT-OFF: 0.01 (ref <1.00)

## 2020-12-28 LAB — HEPATITIS B CORE ANTIBODY, IGM: Hep B C IgM: NONREACTIVE

## 2020-12-28 LAB — HIV ANTIBODY (ROUTINE TESTING W REFLEX): HIV 1&2 Ab, 4th Generation: NONREACTIVE

## 2020-12-28 LAB — HEPATITIS B SURFACE ANTIGEN: Hepatitis B Surface Ag: NONREACTIVE

## 2020-12-28 LAB — REFLEX RMSF IGG TITER: RMSF IgG Titer: >=1:1024 {titer} — AB

## 2021-01-02 ENCOUNTER — Encounter: Payer: Self-pay | Admitting: Family Medicine

## 2021-01-02 ENCOUNTER — Ambulatory Visit: Payer: BC Managed Care – PPO | Admitting: Family Medicine

## 2021-01-02 ENCOUNTER — Other Ambulatory Visit: Payer: Self-pay

## 2021-01-02 VITALS — BP 125/81 | HR 65 | Temp 97.9°F | Ht 72.0 in | Wt 229.4 lb

## 2021-01-02 DIAGNOSIS — R1011 Right upper quadrant pain: Secondary | ICD-10-CM | POA: Diagnosis not present

## 2021-01-02 DIAGNOSIS — A770 Spotted fever due to Rickettsia rickettsii: Secondary | ICD-10-CM

## 2021-01-02 DIAGNOSIS — R630 Anorexia: Secondary | ICD-10-CM | POA: Diagnosis not present

## 2021-01-02 NOTE — Progress Notes (Signed)
OFFICE VISIT  01/02/2021  CC:  Chief Complaint  Patient presents with   Follow-up    HA, fatigue   HPI:    Patient is a 35 y.o. Caucasian male who presents for 2 week f/u HA, fatigue, rash-->lab panel all normal EXCEPT RMSF IgA and IgG highly positive.  EBV serologies showed evidence of past infection. He states he has not filled the doxy I rx'd at last visit.  Currently: still feeling about the same, fatigued and some HAs.  Some mild aches in back of neck and shoulders.  No fevers, rash, joint swelling, or n/v/d. He does describe some constant RUQ pain and poor appetite--seems to have been going on prior to recent illness.     Past Medical History:  Diagnosis Date   Abscess    left buttock   ALLERGIC RHINITIS 05/28/2007   Anxiety    Prozac trial 06/2019-->intol.  Cital 10mg  trial 07/18/19.   Atypical chest pain 2020   11/2018 ED eval-->reassuring/normal.   GERD (gastroesophageal reflux disease)    Gout 2018   MTP.  L ankle.   Headache(784.0)    Hemorrhoid    Pilonidal cyst    Pneumonia 3-4 years ago    Past Surgical History:  Procedure Laterality Date   FRACTURE SURGERY  2009   right wrist   INGUINAL HERNIA REPAIR N/A 12/18/2015   Procedure: LAPAROSCOPIC BILATERAL INGUINAL HERNIA REPAIR;  Surgeon: 12/20/2015 Kinsinger, MD;  Location: [facility];  Service: General;  Laterality: N/A;   INSERTION OF MESH N/A 12/18/2015   Procedure: INSERTION OF MESH;  Surgeon: 12/20/2015 Kinsinger, MD;  Location: [facility];  Service: General;  Laterality: N/A;   PILONIDAL CYST EXCISION  12/30/10   PILONIDAL CYST EXCISION  04/08/2011   Procedure: CYST EXCISION PILONIDAL SIMPLE;  Surgeon: 04/10/2011. Tsuei, MD;  Location: [facility];  Service: General;  Laterality: N/A;  Excision of chronic pilonidal abscess   TONSILLECTOMY      Outpatient Medications Prior to Visit  Medication Sig Dispense Refill   allopurinol (ZYLOPRIM) 100 MG tablet TAKE TWO TABLETS BY MOUTH EVERY DAY 60 tablet 0   diazepam (VALIUM) 2  MG tablet TAKE 1-2 TABLETS BY MOUTH TWICE DAILY AS NEEDED FOR SEVERE ANXIETY 60 tablet 5   omeprazole (PRILOSEC) 40 MG capsule Take 1 capsule (40 mg total) by mouth daily. 90 capsule 3   No facility-administered medications prior to visit.    Allergies  Allergen Reactions   Penicillins Anaphylaxis    Of throat Has patient had a PCN reaction causing immediate rash, facial/tongue/throat swelling, SOB or lightheadedness with hypotension: yes Has patient had a PCN reaction causing severe rash involving mucus membranes or skin necrosis: yes Has patient had a PCN reaction that required hospitalization yes Has patient had a PCN reaction occurring within the last 10 years: yes If all of the above answers are "NO", then may proceed with Cephalosporin use.   Clarithromycin     REACTION: SOB, Palpitations   Sulfamethoxazole-Trimethoprim     Patient unsure of reaction.   Fluoxetine Nausea Only    Nausea and dizziness   Levaquin [Levofloxacin] Other (See Comments)    dizzy    ROS As per HPI  PE: Vitals with BMI 01/02/2021 12/17/2020 02/22/2020  Height 6\' 0"  6\' 0"  -  Weight 229 lbs 6 oz 231 lbs 3 oz 215 lbs  BMI 31.11 31.35 -  Systolic 125 107 -  Diastolic 81 69 -  Pulse 65 59 -   Gen:  Alert, well appearing.  Patient is oriented to person, place, time, and situation. AFFECT: pleasant, lucid thought and speech. RUE:AVWU: no injection, icteris, swelling, or exudate.  EOMI, PERRLA. Mouth: lips without lesion/swelling.  Oral mucosa pink and moist. Oropharynx without erythema, exudate, or swelling.  NECK: no adenopathy.  Mild TTP back of neck/tops of shoulders. CV: RRR, no m/r/g.   LUNGS: CTA bilat, nonlabored resps, good aeration in all lung fields. No rash. ABD: mild RUQ TTP, ?+murphy's sign.  No guarding or rebound.  BS normal.  No HSM.  LABS:    Chemistry      Component Value Date/Time   NA 138 12/17/2020 1510   K 4.5 12/17/2020 1510   CL 102 12/17/2020 1510   CO2 29 12/17/2020  1510   BUN 11 12/17/2020 1510   CREATININE 1.02 12/17/2020 1510   CREATININE 0.92 08/04/2014 1540      Component Value Date/Time   CALCIUM 9.0 12/17/2020 1510   ALKPHOS 63 12/17/2020 1510   AST 24 12/17/2020 1510   ALT 45 12/17/2020 1510   BILITOT 0.4 12/17/2020 1510     Lab Results  Component Value Date   WBC 6.9 12/17/2020   HGB 14.0 12/17/2020   HCT 43.0 12/17/2020   MCV 90.8 12/17/2020   PLT 255.0 12/17/2020   Lab Results  Component Value Date   TSH 1.93 12/17/2020   Lab Results  Component Value Date   ESRSEDRATE 8 12/17/2020   Lab Results  Component Value Date   CRP 1.3 12/17/2020    IMPRESSION AND PLAN:  1) RMSF: I encouraged him to fill doxy rx from last visit and he said he's going to pick it up now. Expect gradual improvement, may take longer than usual b/c of delay in treatment. At this point I have no plan to get convalescent serologies.  2) RUQ pain: will eval further with RUQ u/s.  An After Visit Summary was printed and given to the patient.  FOLLOW UP: Return if symptoms worsen or fail to improve.  Signed:  Santiago Bumpers, MD           01/02/2021

## 2021-07-01 ENCOUNTER — Other Ambulatory Visit: Payer: Self-pay | Admitting: Family Medicine

## 2021-07-01 NOTE — Telephone Encounter (Signed)
Requesting: diazepam ?Contract: 8/05/30/20 ?UDS: n/a ?Last Visit: 01/02/21 ?Next Visit: n/a ?Last Refill: 12/17/20(60,5) ? ?Please Advise. Med pending ? ?

## 2022-04-03 ENCOUNTER — Other Ambulatory Visit: Payer: Self-pay | Admitting: Family Medicine

## 2022-04-10 ENCOUNTER — Other Ambulatory Visit: Payer: Self-pay | Admitting: Family Medicine

## 2022-04-10 MED ORDER — ALLOPURINOL 100 MG PO TABS: ORAL_TABLET | ORAL | 0 refills | Status: AC

## 2022-04-24 ENCOUNTER — Encounter: Payer: Self-pay | Admitting: Family Medicine

## 2022-04-24 ENCOUNTER — Ambulatory Visit: Payer: BC Managed Care – PPO | Admitting: Family Medicine

## 2022-04-24 VITALS — BP 138/82 | HR 87 | Ht 72.0 in | Wt 234.2 lb

## 2022-04-24 DIAGNOSIS — F411 Generalized anxiety disorder: Secondary | ICD-10-CM

## 2022-04-24 DIAGNOSIS — M1000 Idiopathic gout, unspecified site: Secondary | ICD-10-CM

## 2022-04-24 DIAGNOSIS — M109 Gout, unspecified: Secondary | ICD-10-CM | POA: Diagnosis not present

## 2022-04-24 MED ORDER — PREDNISONE 10 MG PO TABS: ORAL_TABLET | ORAL | Status: DC

## 2022-04-24 MED ORDER — DULOXETINE HCL 20 MG PO CPEP: 20.0000 mg | ORAL_CAPSULE | Freq: Every day | ORAL | 1 refills | Status: DC

## 2022-04-24 MED ORDER — COLCHICINE 0.6 MG PO TABS: 0.6000 mg | ORAL_TABLET | Freq: Two times a day (BID) | ORAL | 1 refills | Status: AC

## 2022-04-24 MED ORDER — PREDNISONE 10 MG PO TABS: ORAL_TABLET | ORAL | 0 refills | Status: AC

## 2022-04-24 NOTE — Progress Notes (Signed)
OFFICE VISIT  04/24/2022  CC:  Chief Complaint  Patient presents with   Gout    Flare started 2.5-3 weeks ago.    Patient is a 37 y.o. male who presents for right big toe pain.  HPI: Onset about 2 weeks ago: Pain in the right MTP joint. Swelling, purpleish/redness.  Increased pain when walking/weightbearing. Took colchicine for several days without improvement.  He then ran out of this medicine. No other joints bother him. Has also been taking Tylenol and Advil. We have gotten him on allopurinol in the past but he has been unable to take this with any regularity due to side effect of sedation.  He also says his mouth started bleeding on this medication. He reports having some brief, mild flareups since I last saw him about a year and a half ago but these always respond very quickly to colchicine.  Denies any side effect from colchicine. No recent change in diet.  He limits alcohol intake.  He also has had significant increased anxiety lately.  Lots of stress with work and he is coaching a travel Electronics engineer.  Feels very tense, irritable, poor sleep, poor concentration, chronic worry.  Mood is down a little bit but says not significantly depressed for a prolonged period. Denies panic attacks.   PMP AWARE reviewed today: most recent rx for diazepam was filled 07/01/21, # 21, rx by me. No red flags.  ROS as above, plus--> no fevers, no rashes, No myalgias.  No recent changes in lower legs. No n/v/d or abd pain.  No palpitations.     Past Medical History:  Diagnosis Date   Abscess    left buttock   ALLERGIC RHINITIS 05/28/2007   Anxiety    Prozac trial 06/2019-->intol.  Cital 10mg  trial 07/18/19.   Atypical chest pain 2020   11/2018 ED eval-->reassuring/normal.   GERD (gastroesophageal reflux disease)    Gout 2018   MTP.  L ankle.   Headache(784.0)    Hemorrhoid    Pilonidal cyst    Pneumonia 3-4 years ago    Past Surgical History:  Procedure Laterality Date   FRACTURE  SURGERY  2009   right wrist   INGUINAL HERNIA REPAIR N/A 12/18/2015   Procedure: LAPAROSCOPIC BILATERAL INGUINAL HERNIA REPAIR;  Surgeon: Arta Bruce Kinsinger, MD;  Location: [facility];  Service: General;  Laterality: N/A;   INSERTION OF MESH N/A 12/18/2015   Procedure: INSERTION OF MESH;  Surgeon: Arta Bruce Kinsinger, MD;  Location: [facility];  Service: General;  Laterality: N/A;   PILONIDAL CYST EXCISION  12/30/10   PILONIDAL CYST EXCISION  04/08/2011   Procedure: CYST EXCISION PILONIDAL SIMPLE;  Surgeon: Imogene Burn. Tsuei, MD;  Location: [facility];  Service: General;  Laterality: N/A;  Excision of chronic pilonidal abscess   TONSILLECTOMY      Outpatient Medications Prior to Visit  Medication Sig Dispense Refill   allopurinol (ZYLOPRIM) 100 MG tablet TAKE TWO TABLETS BY MOUTH EVERY DAY 30 tablet 0   diazepam (VALIUM) 2 MG tablet TAKE 1-2 TABLETS BY MOUTH TWICE DAILY AS NEEDED FOR SEVERE ANXIETY 60 tablet 0   omeprazole (PRILOSEC) 40 MG capsule Take 1 capsule (40 mg total) by mouth daily. 90 capsule 3   No facility-administered medications prior to visit.    Allergies  Allergen Reactions   Penicillins Anaphylaxis    Of throat Has patient had a PCN reaction causing immediate rash, facial/tongue/throat swelling, SOB or lightheadedness with hypotension: yes Has patient had a PCN reaction  causing severe rash involving mucus membranes or skin necrosis: yes Has patient had a PCN reaction that required hospitalization yes Has patient had a PCN reaction occurring within the last 10 years: yes If all of the above answers are "NO", then may proceed with Cephalosporin use.   Clarithromycin     REACTION: SOB, Palpitations   Sulfamethoxazole-Trimethoprim     Patient unsure of reaction.   Fluoxetine Nausea Only    Nausea and dizziness   Levaquin [Levofloxacin] Other (See Comments)    dizzy    Review of Systems  As per HPI  PE:    04/24/2022    3:37 PM 01/02/2021    3:28 PM 12/17/2020    2:28 PM   Vitals with BMI  Height 6\' 0"  6\' 0"  6\' 0"   Weight 234 lbs 3 oz 229 lbs 6 oz 231 lbs 3 oz  BMI 31.76 56.25 63.89  Systolic 373 428 768  Diastolic 82 81 69  Pulse 87 65 59     Physical Exam  Gen: Alert, well appearing.  Patient is oriented to person, place, time, and situation. AFFECT: pleasant, lucid thought and speech. Right foot MTP joint with generalized swelling and slight violaceous hue. Some tenderness to palpation is present on the plantar surface of this joint.  LABS:  Last CBC Lab Results  Component Value Date   WBC 6.9 12/17/2020   HGB 14.0 12/17/2020   HCT 43.0 12/17/2020   MCV 90.8 12/17/2020   MCH 29.0 07/01/2016   RDW 12.7 12/17/2020   PLT 255.0 11/57/2620   Last metabolic panel Lab Results  Component Value Date   GLUCOSE 83 12/17/2020   NA 138 12/17/2020   K 4.5 12/17/2020   CL 102 12/17/2020   CO2 29 12/17/2020   BUN 11 12/17/2020   CREATININE 1.02 12/17/2020   CALCIUM 9.0 12/17/2020   PROT 6.9 12/17/2020   ALBUMIN 4.0 12/17/2020   BILITOT 0.4 12/17/2020   ALKPHOS 63 12/17/2020   AST 24 12/17/2020   ALT 45 12/17/2020   Lab Results  Component Value Date   LABURIC 8.4 (H) 10/26/2017   IMPRESSION AND PLAN:  #1 right MTP gouty arthritis. Recurrent. Not improving with colchicine. He cannot tolerate allopurinol. Will treat today with prednisone 30 mg x 3 days, 20 mg x 3 days, then 10 mg x 3 days. Also, will get him on his colchicine 0.6 mg twice a day chronically has our preventative approach for him. He can take an additional colchicine if he starts to get a flare but if not improving with this he will call or return. (Bedside u/s today of R foot MTP jt showed small amount of effusion and synovial thickening, no crystals visualized, no hyperemia, no bony irregularity).  #2 GAD. Low-dose diazepam has been helpful in the past but he wishes to avoid this type of medication. He wants to try low-dose daily antidepressant to help. Note,  intolerant of fluoxetine and citalopram in the past. Will try duloxetine 20 mg a day.  An After Visit Summary was printed and given to the patient.  FOLLOW UP: Return in about 4 weeks (around 05/22/2022) for f/u anxiety and gout.  Signed:  Crissie Sickles, MD           04/24/2022

## 2022-05-02 ENCOUNTER — Encounter: Payer: Self-pay | Admitting: Family Medicine

## 2022-05-02 ENCOUNTER — Other Ambulatory Visit: Payer: Self-pay | Admitting: Family Medicine

## 2022-05-02 NOTE — Telephone Encounter (Signed)
[facility] - Client TELEPHONE ADVICE RECORD [facility] Patient Name: Devin Burton Gender: Male DOB: [birth year] Age: 37 Y 55 M 22 D Return Phone Number: [phone] (Primary) Address: City/State/Zip: [place] [address] Client [facility] - Client Client Site [place] - Day Provider Crissie Sickles - MD Contact Type Call Who Is Calling Patient / Member / Family / Caregiver Call Type Triage / Clinical Relationship To Patient Self Return Phone Number [phone] (Primary) Chief Complaint Dizziness Reason for Call Symptomatic / Request for Health Information Initial Comment Caller states pt is having side effects from Duloxetine 20 mg for depression and anxiety; -just began it last week; hard to focus eyes and dizziness; also prednisone and a gout med; Translation No Nurse Assessment Nurse: Raphael Gibney, RN, Vanita Ingles Date/Time (Eastern Time): 05/02/2022 1:49:11 PM Confirm and document reason for call. If symptomatic, describe symptoms. ---Caller states he is taking duloxetine 20 mg for depression and anxiety. He is taking prednisone and colchicine for gout. he has dizziness and hard to focus his eyes. Started medication on Thursday and symptoms started on Sunday or Monday. has 2 more days on the prednisone Does the patient have any new or worsening symptoms? ---Yes Will a triage be completed? ---Yes Related visit to physician within the last 2 weeks? ---No Does the PT have any chronic conditions? (i.e. diabetes, asthma, this includes High risk factors for pregnancy, etc.) ---Yes List chronic conditions. ---gout; anxiety; depression Is this a behavioral health or substance abuse call? ---No Guidelines Guideline Title Affirmed Question Affirmed Notes Nurse Date/Time (Eastern Time) Dizziness - Lightheadedness [1] MODERATE dizziness (e.g., interferes with normal activities) AND [2] has NOT been evaluated  by Raphael Gibney, RN, Vera 05/02/2022 1:52:15 PM PLEASE NOTE: All timestamps contained within this report are represented as [place] Standard Time. CONFIDENTIALTY NOTICE: This fax transmission is intended only for the addressee. It contains information that is legally privileged, confidential or otherwise protected from use or disclosure. If you are not the intended recipient, you are strictly prohibited from reviewing, disclosing, copying using or disseminating any of this information or taking any action in reliance on or regarding this information. If you have received this fax in error, please notify us immediately by telephone so that we can arrange for its return to [place]. Phone: [phone], Toll-Free: [phone], Fax: [phone] Page: 2 of 2 Call Id: [id number] Guidelines Guideline Title Affirmed Question Affirmed Notes Nurse Date/Time Eilene Ghazi Time) doctor (or NP/PA) for this (Exception: Dizziness caused by heat exposure, sudden standing, or poor fluid intake.) Disp. Time Eilene Ghazi Time) Disposition Final User 05/02/2022 1:58:45 PM See PCP within 24 Hours Yes Raphael Gibney, RN, Vanita Ingles Final Disposition 05/02/2022 1:58:45 PM See PCP within 24 Hours Yes Raphael Gibney, RN, Doreatha Lew Disagree/Comply Disagree Caller Understands Yes PreDisposition Call Doctor Care Advice Given Per Guideline SEE PCP WITHIN 24 HOURS: * IF OFFICE WILL BE OPEN: You need to be examined within the next 24 hours. Call your doctor (or NP/PA) when the office opens and make an appointment. CALL BACK IF: * Passes out (faints) * You become worse CARE ADVICE given per Dizziness (Adult) guideline. Comments User: Dannielle Burn, RN Date/Time Eilene Ghazi Time): 05/02/2022 1:57:32 PM please call pt back regarding his dizziness and what to do about his medication. Pt does not want to go to urgent care User: Dannielle Burn, RN Date/Time Eilene Ghazi Time): 05/02/2022 1:58:19 PM advised pt as per  [email or web address] that duloxetine causes dizziness. Verbalized understanding Referrals REFERRED TO PCP  OFFICE

## 2022-05-09 ENCOUNTER — Other Ambulatory Visit: Payer: Self-pay | Admitting: Family Medicine

## 2022-05-09 MED ORDER — DIAZEPAM 2 MG PO TABS: ORAL_TABLET | ORAL | 0 refills | Status: DC

## 2022-05-09 NOTE — Telephone Encounter (Signed)
Requesting: valium 2mg  Contract: 12/17/20 UDS: n/a Last Visit: 04/24/22 Next Visit: 05/23/22 Last Refill: 07/01/21 (60,0)  Please Advise. Med pending

## 2022-05-12 ENCOUNTER — Other Ambulatory Visit: Payer: Self-pay | Admitting: Family Medicine

## 2022-05-12 NOTE — Telephone Encounter (Signed)
Pt advised refill sent. °

## 2022-05-23 ENCOUNTER — Ambulatory Visit: Payer: BC Managed Care – PPO | Admitting: Family Medicine

## 2022-05-23 NOTE — Progress Notes (Deleted)
OFFICE VISIT  05/23/2022  CC: No chief complaint on file.   Patient is a 37 y.o. male who presents for 1 month follow-up gout and anxiety. A/P as of last visit: "#1 right MTP gouty arthritis. Recurrent. Not improving with colchicine. He cannot tolerate allopurinol. Will treat today with prednisone 30 mg x 3 days, 20 mg x 3 days, then 10 mg x 3 days. Also, will get him on his colchicine 0.6 mg twice a day chronically has our preventative approach for him. He can take an additional colchicine if he starts to get a flare but if not improving with this he will call or return. (Bedside u/s today of R foot MTP jt showed small amount of effusion and synovial thickening, no crystals visualized, no hyperemia, no bony irregularity).   #2 GAD. Low-dose diazepam has been helpful in the past but he wishes to avoid this type of medication. He wants to try low-dose daily antidepressant to help. Note, intolerant of fluoxetine and citalopram in the past. Will try duloxetine 20 mg a day."  INTERIM HX: He called not long after last visit and stated that duloxetine was making him dizzy.  Stopped this medication and we restarted diazepam.   PMP AWARE reviewed today: most recent rx for diazepam 2 mg was filled 07/01/2021, # 44, rx by me. No red flags.  Past Medical History:  Diagnosis Date   Abscess    left buttock   ALLERGIC RHINITIS 05/28/2007   Anxiety    Prozac trial 06/2019-->intol.  Cital '10mg'$  ->?pt take or not?.  Cymbalta trial 04/2022->dizzy and vision c/o   Atypical chest pain 2020   11/2018 ED eval-->reassuring/normal.   GERD (gastroesophageal reflux disease)    Gout 2018   MTP.  L ankle.   Headache(784.0)    Hemorrhoid    Pilonidal cyst    Pneumonia 3-4 years ago    Past Surgical History:  Procedure Laterality Date   FRACTURE SURGERY  2009   right wrist   INGUINAL HERNIA REPAIR N/A 12/18/2015   Procedure: LAPAROSCOPIC BILATERAL INGUINAL HERNIA REPAIR;  Surgeon: Arta Bruce  Kinsinger, MD;  Location: [facility];  Service: General;  Laterality: N/A;   INSERTION OF MESH N/A 12/18/2015   Procedure: INSERTION OF MESH;  Surgeon: Arta Bruce Kinsinger, MD;  Location: [facility];  Service: General;  Laterality: N/A;   PILONIDAL CYST EXCISION  12/30/10   PILONIDAL CYST EXCISION  04/08/2011   Procedure: CYST EXCISION PILONIDAL SIMPLE;  Surgeon: Imogene Burn. Tsuei, MD;  Location: [facility];  Service: General;  Laterality: N/A;  Excision of chronic pilonidal abscess   TONSILLECTOMY      Outpatient Medications Prior to Visit  Medication Sig Dispense Refill   allopurinol (ZYLOPRIM) 100 MG tablet TAKE TWO TABLETS BY MOUTH EVERY DAY 30 tablet 0   colchicine 0.6 MG tablet Take 1 tablet (0.6 mg total) by mouth 2 (two) times daily. 180 tablet 1   diazepam (VALIUM) 2 MG tablet 1 tab po bid prn anxiety 60 tablet 0   omeprazole (PRILOSEC) 40 MG capsule Take 1 capsule (40 mg total) by mouth daily. 90 capsule 3   predniSONE (DELTASONE) 10 MG tablet 3 tabs po qd x 3d then 2 tabs po qd x 3d then 1 tab po qd x 3d 18 tablet 0   No facility-administered medications prior to visit.    Allergies  Allergen Reactions   Penicillins Anaphylaxis    Of throat Has patient had a PCN reaction causing immediate rash, facial/tongue/throat  swelling, SOB or lightheadedness with hypotension: yes Has patient had a PCN reaction causing severe rash involving mucus membranes or skin necrosis: yes Has patient had a PCN reaction that required hospitalization yes Has patient had a PCN reaction occurring within the last 10 years: yes If all of the above answers are "NO", then may proceed with Cephalosporin use.   Clarithromycin     REACTION: SOB, Palpitations   Duloxetine Other (See Comments)    dizzy   Sulfamethoxazole-Trimethoprim     Patient unsure of reaction.   Fluoxetine Nausea Only    Nausea and dizziness   Levaquin [Levofloxacin] Other (See Comments)    dizzy    Review of Systems As per HPI  PE:     04/24/2022    3:37 PM 01/02/2021    3:28 PM 12/17/2020    2:28 PM  Vitals with BMI  Height '6\' 0"'$  '6\' 0"'$  '6\' 0"'$   Weight 234 lbs 3 oz 229 lbs 6 oz 231 lbs 3 oz  BMI 31.76 [id number]  Systolic 0000000 0000000 [id number]  Diastolic 82 81 69  Pulse 87 65 59     Physical Exam  ***  LABS:  Last CBC Lab Results  Component Value Date   WBC 6.9 12/17/2020   HGB 14.0 12/17/2020   HCT 43.0 12/17/2020   MCV 90.8 12/17/2020   MCH 29.0 07/01/2016   RDW 12.7 12/17/2020   PLT 255.0 [id number]   Last metabolic panel Lab Results  Component Value Date   GLUCOSE 83 12/17/2020   NA 138 12/17/2020   K 4.5 12/17/2020   CL 102 12/17/2020   CO2 29 12/17/2020   BUN 11 12/17/2020   CREATININE 1.02 12/17/2020   CALCIUM 9.0 12/17/2020   PROT 6.9 12/17/2020   ALBUMIN 4.0 12/17/2020   BILITOT 0.4 12/17/2020   ALKPHOS 63 12/17/2020   AST 24 12/17/2020   ALT 45 12/17/2020   Lab Results  Component Value Date   LABURIC 8.4 (H) 10/26/2017   IMPRESSION AND PLAN:  No problem-specific Assessment & Plan notes found for this encounter.  ?uloric trial?  An After Visit Summary was printed and given to the patient.  FOLLOW UP: No follow-ups on file.  Signed:  Crissie Sickles, MD           05/23/2022

## 2022-06-20 ENCOUNTER — Encounter: Payer: Self-pay | Admitting: Family Medicine

## 2022-08-01 ENCOUNTER — Other Ambulatory Visit: Payer: Self-pay | Admitting: Family Medicine

## 2022-08-01 NOTE — Telephone Encounter (Signed)
Requesting: diazepam Contract: 12/17/20 UDS: n/a Last Visit:  04/24/22 Next Visit: 4 week f/u not completed; 2/2 appt no show Last Refill: 05/09/22 (60,0)  Please Advise. Med pending
